# Patient Record
Sex: Male | Born: 1970
Health system: Southern US, Community
[De-identification: ages and names within clinical notes are randomized; demographics above are authoritative.]

## PROBLEM LIST (undated history)

## (undated) DIAGNOSIS — I10 Essential (primary) hypertension: Secondary | ICD-10-CM

## (undated) HISTORY — PX: APPENDECTOMY: SHX54

---

## 2007-11-02 ENCOUNTER — Emergency Department (HOSPITAL_COMMUNITY): Admission: EM | Admit: 2007-11-02 | Discharge: 2007-11-02 | Payer: Self-pay | Admitting: Emergency Medicine

## 2007-11-07 ENCOUNTER — Ambulatory Visit: Payer: Self-pay | Admitting: Family Medicine

## 2007-11-07 DIAGNOSIS — F17200 Nicotine dependence, unspecified, uncomplicated: Secondary | ICD-10-CM | POA: Insufficient documentation

## 2007-11-07 DIAGNOSIS — I4949 Other premature depolarization: Secondary | ICD-10-CM | POA: Insufficient documentation

## 2007-12-05 ENCOUNTER — Ambulatory Visit: Payer: Self-pay | Admitting: Family Medicine

## 2007-12-28 ENCOUNTER — Ambulatory Visit: Payer: Self-pay | Admitting: Family Medicine

## 2007-12-28 LAB — CONVERTED CEMR LAB
Direct LDL: 145 mg/dL
HDL: 33.7 mg/dL — ABNORMAL LOW (ref >39.0)
Total CHOL/HDL Ratio: 7.2
Triglycerides: 226 mg/dL (ref 0–149)
VLDL: 45 mg/dL — ABNORMAL HIGH (ref 0–40)

## 2008-02-16 ENCOUNTER — Telehealth: Payer: Self-pay | Admitting: Family Medicine

## 2008-03-06 ENCOUNTER — Ambulatory Visit: Payer: Self-pay | Admitting: Family Medicine

## 2008-03-06 DIAGNOSIS — E785 Hyperlipidemia, unspecified: Secondary | ICD-10-CM | POA: Insufficient documentation

## 2008-09-13 ENCOUNTER — Ambulatory Visit: Payer: Self-pay | Admitting: Family Medicine

## 2008-09-13 LAB — CONVERTED CEMR LAB: VLDL: 42.8 mg/dL — ABNORMAL HIGH (ref 0.0–40.0)

## 2010-05-13 ENCOUNTER — Encounter: Payer: Self-pay | Admitting: Internal Medicine

## 2010-05-13 ENCOUNTER — Ambulatory Visit (INDEPENDENT_AMBULATORY_CARE_PROVIDER_SITE_OTHER): Payer: 59 | Admitting: Internal Medicine

## 2010-05-13 VITALS — BP 112/80 | Temp 98.0°F | Ht 72.0 in | Wt 280.0 lb

## 2010-05-13 DIAGNOSIS — E785 Hyperlipidemia, unspecified: Secondary | ICD-10-CM

## 2010-05-13 DIAGNOSIS — R7309 Other abnormal glucose: Secondary | ICD-10-CM

## 2010-05-13 DIAGNOSIS — E119 Type 2 diabetes mellitus without complications: Secondary | ICD-10-CM

## 2010-05-13 LAB — HEMOGLOBIN A1C: Hgb A1c MFr Bld: 9 % — ABNORMAL HIGH (ref 4.6–6.5)

## 2010-05-13 LAB — GLUCOSE, POCT (MANUAL RESULT ENTRY): POC Glucose: 171

## 2010-05-13 MED ORDER — METFORMIN HCL 1000 MG PO TABS: 1000.0000 mg | ORAL_TABLET | Freq: Two times a day (BID) | ORAL | Status: DC

## 2010-05-13 NOTE — Patient Instructions (Signed)
It is important that you exercise regularly, at least 20 minutes 3 to 4 times per week.  If you develop chest pain or shortness of breath seek  medical attention.  You need to lose weight.  Consider a lower calorie diet and regular exercise.   Please check your hemoglobin A1c every 3 months   return in 3 weeks for followup Diabetes and Exercise Regular exercise is important and can help:    Control blood glucose (sugar).   Decrease blood pressure.   Control blood lipids (cholesterol and triglycerides).   Improve overall health.  BENEFITS FROM EXERCISE:  Improved fitness.   Improved flexibility.   Improved endurance.   Increased bone density.   Weight control.   Increased muscle strength.   Decreased body fat.   Improvement of the body's use of a hormone called insulin.   Increased insulin sensitivity.   Reduction of insulin needs.   Helps you feel better.   Reduces stress and tension.  People with diabetes who add exercise to their lifestyle gain additional benefits.    Weight loss.   Reduces appetite.   Improves body's use of blood glucose (sugar).   Decreases risk factors for heart disease:   Lowering of cholesterol and triglycerides.   Raising the level of good cholesterol (high-density lipoproteins [HDL]).   Lowering blood sugar.   Decreases blood pressure.  TYPE 1 DIABETES AND EXERCISE  Exercise will usually lower your blood glucose.   If blood glucose is greater than 240 mg/dl, check urine ketones. If ketones are present, do not exercise.   Location of the insulin injection sites may need to be adjusted with exercise. Avoid injecting insulin into areas of the body that will be exercised. For example, avoid injecting insulin into:   The arms when playing tennis.   The legs when jogging. For more information, discuss this with your caregiver.   Keep a record of:   Food intake.   Type and amount of exercise.   Expected peak times of  insulin action.   Blood glucose (sugar) levels.  Do this before, during and after exercise. Review your records with your caregiver(s). This will help you to develop guidelines for adjusting food intake and/or insulin amounts.   TYPE 2 DIABETES AND EXERCISE  Regular physical activity can help control blood glucose.   Exercise is important because it may:   Increase the body's sensitivity to insulin.   Improve blood glucose control.   Exercise reduces the risk of heart disease. It decreases serum cholesterol and triglycerides. It also lowers blood pressure.   Those who take insulin or oral hypoglycemic agents should watch for signs of hypoglycemia. These signs include dizziness, shaking, sweating, chills and confusion.   Body water is lost during exercise. It must be replaced. This will help to avoid loss of body fluids (dehydration) and/or heat stroke.  Be sure to talk to your caregiver before starting an exercise program to make sure it is safe for you. Remember, any activity is better than none.   Document Released: 05/16/2003 Document Re-Released: 12/21/2008 [facility] Patient Information 2011 [place], [place].Diabetes Meal Planning Guide The diabetes meal planning guide is a tool to help you plan your meals and snacks. It is important for people with diabetes to manage their blood sugar levels. Choosing the right foods and the right amounts throughout your day will help control your blood sugar. Eating right can even help you improve your blood pressure and reach or maintain a healthy weight. CARBOHYDRATE  COUNTING MADE EASY When you eat carbohydrates, they turn to sugar (glucose). This raises your blood sugar level. Counting carbohydrates can help you control this level so you feel better. When you plan your meals by counting carbohydrates, you can have more flexibility in what you eat and balance your medicine with your food intake. Carbohydrate counting simply means adding up the total  amount of carbohydrate grams (g) in your meals or snacks. Try to eat about the same amount at each meal. Foods with carbohydrates are listed below. Each portion below is 1 carbohydrate serving or 15 grams of carbohydrates. Ask your dietician how many grams of carbohydrates you should eat at each meal or snack. Grains and Starches 1 slice bread 1/2 English muffin or hotdog/hamburger bun 3/4 cup cold cereal (unsweetened) 1/3 cup cooked pasta or rice 1/2 cup starchy vegetables (corn, potatoes, peas, beans, winter squash) 1 tortilla (6 inches) 1/4 bagel 1 waffle or pancake (size of a CD) 1/2 cup cooked cereal 4 to 6 small crackers *Whole grain is recommended Fruit 1 cup fresh unsweetened berries, melon, papaya, pineapple 1 small fresh fruit 1/2 banana or mango 1/2 cup fruit juice (4 ounces unsweetened) 1/2 cup canned fruit in natural juice or water 2 tablespoons dried fruit 12 to 15 grapes or cherries Milk and Yogurt 1 cup fat-free or 1% milk 1 cup soy milk 6 ounces light yogurt with sugar-free sweetener 6 ounces low-fat soy yogurt 6 ounces plain yogurt Vegetables 1 cup raw or 1/2 cup cooked is counted as 0 carbohydrates or a "free" food. If you eat 3 or more servings at one meal, count them as 1 carbohydrate serving. Other Carbohydrates 3/4 ounces chips or pretzels 1/2 cup ice cream or frozen yogurt 1/4 cup sherbet or sorbet 2 inch square cake, no frosting 1 tablespoon honey, sugar, jam, jelly, or syrup 2 small cookies 3 squares of graham crackers 3 cups popcorn 6 crackers 1 cup broth-based soup Count 1 cup casserole or other mixed foods as 2 carbohydrate servings. Foods with less than 20 calories in a serving may be counted as 0 carbohydrates or a "free" food. You may want to purchase a book or computer software that lists the carbohydrate gram counts of different foods. In addition, the nutrition facts panel on the labels of the foods you eat are a good source of this  information. The label will tell you how big the serving size is and the total number of carbohydrate grams you will be eating per serving. Divide this number by 15 to obtain the number of carbohydrate servings in a portion. Remember: 1 carbohydrate serving equals 15 grams of carbohydrate. SERVING SIZES Measuring foods and serving sizes helps you make sure you are getting the right amount of food. The list below tells how big or small some common serving sizes are. MARGIN-BOTTOM: 0mm  1 ounce (oz) of cheese.................................4 stacked dice.  2 to 3 oz cooked meat.................................Marland KitchenDeck of cards.  1 teaspoon (tsp)...........................................Marland KitchenTip of little finger.  1 tablespoon (tbs).......................................Marland KitchenMarland KitchenThumb.  2 tbs............................................................Marland KitchenGolf ball.   cup..........................................................Marland KitchenHalf of a fist.  1 cup...........................................................Marland KitchenA fist.  SAMPLE DIABETES MEAL PLAN Below is a sample meal plan that includes foods from the grain and starches, dairy, vegetable, fruit, and meat groups. A dietician can individualize a meal plan to fit your calorie needs and tell you the number of servings needed from each food group. However, controlling the total amount of carbohydrates in your meal or snack is more important than making sure you include all of  the food groups at every meal. You may interchange carbohydrate containing foods (dairy, starches, and fruits). The meal plan below is an example of a 2000 calorie diet using carbohydrate counting. This meal plan has 17 carbohydrate servings (carb choices). Breakfast  1 cup oatmeal (2 carb choices)  3/4 cup light yogurt (1 carb choice) 1 cup blueberries (1 carb choice) 1/4 cup almonds   Snack  1 large apple (2 carb choices)  1 low-fat string cheese stick   Lunch  Chicken breast salad:   1 cup  spinach     1/4 cup chopped tomatoes     2 oz chicken breast, sliced     2 tbs low-fat [place] dressing  12 whole-wheat crackers (2 carb choices) 12 to 15 grapes (1 carb choice) 1 cup low-fat milk (1 carb choice)   Snack  1 cup carrots  1/2 cup hummus (1 carb choice)   Dinner  3 oz broiled salmon  1 cup brown rice (3 carb choices)   Snack  1 1/2 cups steamed broccoli (1 carb choice) drizzled with 1 tsp olive oil and lemon juice  1 cup light pudding (2 carb choices)   DIABETES MEAL PLANNING WORKSHEET Your dietician can use this worksheet to help you decide how many servings of foods and what types of foods are right for you.   Breakfast Food Group and Servings Carb Choices Grain/Starches _______________________________________ Dairy ______________________________________________ Vegetable _______________________________________ Fruit _______________________________________________ Meat _______________________________________________ Fat _____________________________________________ Lunch Food Group and Servings Carb Choices Grain/Starches ________________________________________ Dairy _______________________________________________ Fruit ________________________________________________ Meat ________________________________________________ Fat _____________________________________________ Dinner Food Group and Servings Carb Choices Grain/Starches ________________________________________ Dairy _______________________________________________ Fruit ________________________________________________ Meat ________________________________________________ Fat _____________________________________________ Snacks Food Group and Servings Carb Choices Grain/Starches ________________________________________ Dairy _______________________________________________ Vegetable ________________________________________ Fruit ________________________________________________ Meat  ________________________________________________ Fat _____________________________________________ Daily Totals Starches _________________________  Vegetable __________________________ Fruit ______________________________ Dairy ______________________________ Meat ______________________________ Fat ________________________________   Document Released: 11/20/2004 Document Re-Released: 08/13/2009 [employer] Patient Information 2011 [place], [place].Diabetes, Type 2 Diabetes is a lasting (chronic) disease. In type 2 diabetes, the pancreas does not make enough insulin (a hormone), and the body does not respond normally to the insulin that is made. This type of diabetes was also previously called adult onset diabetes. About 90% of all those who have diabetes have type 2. It usually occurs after the age of 47 but can occur at any age. CAUSES Unlike type 1 diabetes, which happens because insulin is no longer being made, type 2 diabetes happens because the body is making less insulin and has trouble using the insulin properly. SYMPTOMS  Drinking more than usual.   Urinating more than usual.   Blurred vision.   Dry, itchy skin.   Frequent infection like yeast infections in women.   More tired than usual (fatigue).  TREATMENT  Healthy eating.   Exercise.   Medication, if needed.   Monitoring blood glucose (sugar).   Seeing your caregiver regularly.  HOME CARE INSTRUCTIONS  Check your blood glucose (sugar) at least once daily. More frequent monitoring may be necessary, depending on your medications and on how well your diabetes is controlled. Your caregiver will advise you.   Take your medicine as directed by your caregiver.   Do not smoke.   Make wise food choices. Ask your caregiver for information. Weight loss can improve your diabetes.   Learn about low blood glucose (hypoglycemia) and how to treat it.   Get your eyes checked regularly.   Have a yearly physical exam. Have  your blood pressure checked.  Get your blood and urine tested.   Wear a pendant or bracelet saying that you have diabetes.   Check your feet every night for sores. Let your caregiver know if you have sores that are not healing.  SEEK MEDICAL CARE IF:  You are having problems keeping your blood glucose at target range.   You feel you might be having problems with your medicines.   You have symptoms of an illness that is not improving after 24 hours.   You have a sore or wound that is not healing.   You notice a change in vision or a new problem with your vision.   You develop a fever of more than  102 degrees.  Document Released: 02/23/2005 Document Re-Released: 03/17/2009 [facility] Patient Information 2011 [place], [place].

## 2010-05-13 NOTE — Progress Notes (Signed)
  Subjective:    Patient ID: Nicholas [name], male    DOB: 12-06-1970, 40 y.o.   MRN: [id number]  HPI   40 year old patient who has a strong family history diabetes it is noted to have the persistent hyperglycemia with random blood sugars occasionally in excess of 300. He denies any hyperglycemic symptoms. He has a history of mild dyslipidemia and his last total cholesterol 220. He discontinued tobacco use in September of 2009. He does have a history of exogenous obesity    Review of Systems  Constitutional: Negative for fever, chills, appetite change and fatigue.  HENT: Negative for hearing loss, ear pain, congestion, sore throat, trouble swallowing, neck stiffness, dental problem, voice change and tinnitus.   Eyes: Negative for pain, discharge and visual disturbance.  Respiratory: Negative for cough, chest tightness, wheezing and stridor.   Cardiovascular: Negative for chest pain, palpitations and leg swelling.  Gastrointestinal: Negative for nausea, vomiting, abdominal pain, diarrhea, constipation, blood in stool and abdominal distention.  Genitourinary: Negative for urgency, hematuria, flank pain, discharge, difficulty urinating and genital sores.  Musculoskeletal: Negative for myalgias, back pain, joint swelling, arthralgias and gait problem.  Skin: Negative for rash.  Neurological: Negative for dizziness, syncope, speech difficulty, weakness, numbness and headaches.  Hematological: Negative for adenopathy. Does not bruise/bleed easily.  Psychiatric/Behavioral: Negative for behavioral problems and dysphoric mood. The patient is not nervous/anxious.        Objective:   Physical Exam  Constitutional: He appears well-developed and well-nourished. No distress.        Weight 280   blood pressure 112/72          Assessment & Plan:   new onset diabetes  Exogenous obesity  Dyslipidemia   We'll start on metformin and titrate the dose to 1000 mg twice daily. Exercise weight loss diet  all discussed and encouraged. Diabetic information dispensed as well as a glucometer. A hemoglobin A1c will be checked today. He is asked return in 3 weeks for followup

## 2010-05-19 ENCOUNTER — Ambulatory Visit: Payer: Self-pay | Admitting: Family Medicine

## 2010-05-20 ENCOUNTER — Telehealth: Payer: Self-pay | Admitting: Family Medicine

## 2010-05-20 MED ORDER — ONETOUCH ULTRASOFT LANCETS MISC: Status: DC

## 2010-05-20 MED ORDER — GLUCOSE BLOOD VI STRP: ORAL_STRIP | Status: DC

## 2010-05-20 NOTE — Telephone Encounter (Signed)
Was given a meter and tests strips when he saw Dr Kirtland Bouchard. Pt is requesting a new rxs for test strips and lancets sent to [facility]. Please return his call when done.

## 2010-05-22 ENCOUNTER — Telehealth: Payer: Self-pay | Admitting: Internal Medicine

## 2010-05-22 MED ORDER — GLUCOSE BLOOD VI STRP: ORAL_STRIP | Status: DC

## 2010-05-22 NOTE — Telephone Encounter (Signed)
rx sent and spoke with pharamcy

## 2010-05-22 NOTE — Telephone Encounter (Signed)
Pt called again and stated the the pharmacy did not receive his rx for One Touch Blue Delicate test strips. Please send to his pharmacy as listed on previous note and return his call when done, asap.

## 2010-06-05 ENCOUNTER — Encounter: Payer: Self-pay | Admitting: Family Medicine

## 2010-06-05 ENCOUNTER — Ambulatory Visit (INDEPENDENT_AMBULATORY_CARE_PROVIDER_SITE_OTHER): Payer: 59 | Admitting: Family Medicine

## 2010-06-05 VITALS — BP 120/84 | Temp 98.2°F | Wt 270.0 lb

## 2010-06-05 DIAGNOSIS — IMO0001 Reserved for inherently not codable concepts without codable children: Secondary | ICD-10-CM

## 2010-06-05 DIAGNOSIS — E1165 Type 2 diabetes mellitus with hyperglycemia: Secondary | ICD-10-CM

## 2010-06-05 DIAGNOSIS — E1142 Type 2 diabetes mellitus with diabetic polyneuropathy: Secondary | ICD-10-CM | POA: Insufficient documentation

## 2010-06-05 MED ORDER — METFORMIN HCL 1000 MG PO TABS: ORAL_TABLET | ORAL | Status: DC

## 2010-06-05 MED ORDER — LISINOPRIL 5 MG PO TABS: 5.0000 mg | ORAL_TABLET | Freq: Every day | ORAL | Status: DC

## 2010-06-05 NOTE — Patient Instructions (Signed)
Split the metformin, take 500 mg prior to breakfast and 500 mg prior to evening meal.  Walk 30 minutes daily.  Drink 30 ounces of water daily.  Check a fasting blood sugar daily.  Add lisinopril,,,,,,,,,,, the ACE inhibitor for renal protection,,,,,,,, 5 mg daily,,,,,,,,, remembered to look for hives, and/or cough  as potential side effects  Return in one month for follow-up.  We will also get set up for a nutrition consult

## 2010-06-05 NOTE — Progress Notes (Signed)
  Subjective:    Patient ID: Nicholas [name], male    DOB: 05/08/1970, 40 y.o.   MRN: [id number]  HPIjeremi Is a 40 year old male, recently diagnosed with type 2 diabetes, who comes in today for follow-up.  Nicholas [name] saw Dr. Mariella Saa weeks ago and was put on metformin 1000 mg b.i.d., but Nicholas [name] couldn't tolerate it.  GI-wise.  His hemoglobin A1c was 9.0 with a blood sugar of 171 at that point.  His only taken metformin 1000 mg after his evening meal.  However, Nicholas [name] still having GI side effects.  BP normal 120/84    Review of Systems      General and metabolic review of systems otherwise negative Objective:   Physical Exam Well-developed well-nourished man no acute distress,,,,,,,,,,,,,,,, Nicholas [name]'s lost 10 pounds via diet since Nicholas [name] was here last       Assessment & Plan:  Diabetes type II,,,,,,,,,,, split metformin 500 b.i.d., baby aspirin daily, nutrition consult with the nutritionist, walk 30 minutes.  A day, fasting blood sugar daily, and ACE inhibitor, follow up in 4 weeks

## 2010-07-07 ENCOUNTER — Encounter: Payer: Self-pay | Admitting: Family Medicine

## 2010-07-07 ENCOUNTER — Ambulatory Visit (INDEPENDENT_AMBULATORY_CARE_PROVIDER_SITE_OTHER): Payer: 59 | Admitting: Family Medicine

## 2010-07-07 VITALS — BP 130/84 | Temp 98.3°F | Wt 258.0 lb

## 2010-07-07 DIAGNOSIS — IMO0001 Reserved for inherently not codable concepts without codable children: Secondary | ICD-10-CM

## 2010-07-07 DIAGNOSIS — E1165 Type 2 diabetes mellitus with hyperglycemia: Secondary | ICD-10-CM

## 2010-07-07 MED ORDER — GLIPIZIDE 5 MG PO TABS: 5.0000 mg | ORAL_TABLET | Freq: Two times a day (BID) | ORAL | Status: DC

## 2010-07-07 NOTE — Patient Instructions (Signed)
Before breakfast, take 500 mg of metformin and 5 mg of glipizide,,,,,,,,,, prior to evening meal, take 1000 mg of metformin and 5 mg of glipizide.  Check a fasting blood sugar daily in the morning.  Return in 4 weeks for follow-up with all your blood sugar readings

## 2010-07-07 NOTE — Progress Notes (Signed)
  Subjective:    Patient ID: Nissan Frazzini, male    DOB: [birth year], 40 y.o.   MRN: [id number]  HPIjeremi History 40 year old male, type II diabetic, who comes in today for follow-up of his diabetes.  We had him on metformin 500 b.i.d. However, his blood sugar jumped up.  His A1c was 9.  Dr. Kirtland Bouchard. Increase the metformin 2000 b.i.d. However, he cannot tolerated GI-wise.  He cut the dose down to 500 morning thousand 4.  His evening meal.  He still having symptoms of cramps and bloating and loose bowel movements.  He thinks he can tolerate this.  We talked about other options, including starting insulin, however, he would like to try other oral medicines first    Review of Systems    General and metabolic review of systems otherwise negative Objective:   Physical Exam    Well-developed well-nourished, and in no acute distress    Assessment & Plan:  Diabetes type II.  Plan add Glucotrol 5 b.i.d. Continue metformin 1000 in the p.m., 500 mg in the a.m. Fasting blood sugar daily.  Follow-up in one month

## 2010-07-18 ENCOUNTER — Other Ambulatory Visit: Payer: Self-pay | Admitting: Family Medicine

## 2010-07-18 MED ORDER — ONETOUCH DELICA LANCETS MISC: Status: DC

## 2010-07-18 MED ORDER — GLUCOSE BLOOD VI STRP: ORAL_STRIP | Status: DC

## 2010-07-18 NOTE — Telephone Encounter (Signed)
Need new rxs for One Touch Ultra test strips and One Touch Delica lancets called to [facility].

## 2010-08-07 ENCOUNTER — Ambulatory Visit: Payer: 59 | Admitting: Family Medicine

## 2010-08-14 ENCOUNTER — Telehealth: Payer: Self-pay | Admitting: Family Medicine

## 2010-08-14 DIAGNOSIS — E1165 Type 2 diabetes mellitus with hyperglycemia: Secondary | ICD-10-CM

## 2010-08-14 MED ORDER — METFORMIN HCL 1000 MG PO TABS: ORAL_TABLET | ORAL | Status: DC

## 2010-08-14 NOTE — Telephone Encounter (Signed)
Triage vm----------please clarify dosing of Metformin HCT 1000mg . Directions says 1/2 tablet bid before meals. Is it 1 1/2 or 1/2 tablet. Pt was on 1qd previously.

## 2010-08-28 ENCOUNTER — Ambulatory Visit: Payer: 59 | Admitting: Family Medicine

## 2010-08-29 ENCOUNTER — Other Ambulatory Visit: Payer: Self-pay | Admitting: *Deleted

## 2010-08-29 MED ORDER — GLUCOSE BLOOD VI STRP: 1.0000 | ORAL_STRIP | Freq: Three times a day (TID) | Status: DC

## 2010-08-29 NOTE — Telephone Encounter (Signed)
rx clarification

## 2010-09-15 ENCOUNTER — Encounter: Payer: Self-pay | Admitting: Family Medicine

## 2010-09-15 ENCOUNTER — Ambulatory Visit (INDEPENDENT_AMBULATORY_CARE_PROVIDER_SITE_OTHER): Payer: 59 | Admitting: Family Medicine

## 2010-09-15 VITALS — BP 120/80 | Temp 98.0°F | Wt 278.0 lb

## 2010-09-15 DIAGNOSIS — E1165 Type 2 diabetes mellitus with hyperglycemia: Secondary | ICD-10-CM

## 2010-09-15 DIAGNOSIS — IMO0001 Reserved for inherently not codable concepts without codable children: Secondary | ICD-10-CM

## 2010-09-15 LAB — BASIC METABOLIC PANEL
BUN: 15 mg/dL (ref 6–23)
GFR: 101.82 mL/min (ref >60.00)
Potassium: 4.3 mEq/L (ref 3.5–5.1)
Sodium: 139 mEq/L (ref 135–145)

## 2010-09-15 LAB — MICROALBUMIN / CREATININE URINE RATIO: Microalb, Ur: 3.9 mg/dL — ABNORMAL HIGH (ref 0.0–1.9)

## 2010-09-15 LAB — HEMOGLOBIN A1C: Hgb A1c MFr Bld: 6.1 % (ref 4.6–6.5)

## 2010-09-15 NOTE — Progress Notes (Signed)
  Subjective:    Patient ID: Nicholas [name], male    DOB: [birth year], 40 y.o.   MRN: [id number]  HPI arsen Is a 40 year old male, who comes in today for follow-up of diabetes, type II.  He currently taking metformin 1000 mg in the evening and 500 mg in the morning prior to breakfast.  He cannot take any more medications for her because of GI side effects.  His glipizide is 5 mg b.i.d. Blood sugar in the morning is around 135 in the evening is around 145.  No hypoglycemia.  Last A1c in March was 9.0%.  Is due for an eye exam referred to Dr. Vonna Kotyk   Review of Systems General and metabolic review of systems otherwise negative.  BP normal 120/80    Objective:   Physical Exam    Well-developed nourished, male in no acute distress    Assessment & Plan:  Diabetes type II.  Increased Glucotrol 10 mg in the evening 5 mg in the morning.  If after two weeks.  Blood sugar is still not at 101.  Increase glipizide to 10 mg b.i.d.  Follow-up in 6 weeks here

## 2010-09-15 NOTE — Patient Instructions (Signed)
Increase the glipizide to 10 mg in the evening and continue the 5-mg dose in the morning.  However, if in two weeks.  Her blood sugars not at goal,,,,,,,,,,,, fasting in the morning, 100,,,,,,,, then increase the glipizide to 10 mg twice daily.  Continue current dose of metformin.  Follow-up in 6 weeks.  I will call u The report of your A1c

## 2010-09-17 NOTE — Progress Notes (Signed)
Left message on machine for patient

## 2010-10-13 ENCOUNTER — Telehealth: Payer: Self-pay | Admitting: Family Medicine

## 2010-10-13 NOTE — Telephone Encounter (Signed)
Pt is requesting his dosage be adjusted he is currently splitting a 1000mg  pill in half and taking 500 in the am and 500 in the pm. Pt is having an issue with splitting the pill and is worried he isnt getting the right dose. Please contact pt.

## 2010-10-14 MED ORDER — METFORMIN HCL 500 MG PO TABS: 500.0000 mg | ORAL_TABLET | Freq: Two times a day (BID) | ORAL | Status: DC

## 2010-10-14 NOTE — Telephone Encounter (Signed)
Okay to e-mail and the metformin 500-mg tabs number 200 directions one p.o. B.i.d. Refills x 3, so he doesn't have to split the 1000 mg tablets

## 2010-10-27 ENCOUNTER — Ambulatory Visit: Payer: 59 | Admitting: Family Medicine

## 2010-11-17 ENCOUNTER — Ambulatory Visit: Payer: 59 | Admitting: Family Medicine

## 2010-12-08 ENCOUNTER — Ambulatory Visit: Payer: 59 | Admitting: Family Medicine

## 2011-06-06 ENCOUNTER — Other Ambulatory Visit: Payer: Self-pay | Admitting: Family Medicine

## 2011-06-22 ENCOUNTER — Ambulatory Visit (INDEPENDENT_AMBULATORY_CARE_PROVIDER_SITE_OTHER): Payer: 59 | Admitting: Family Medicine

## 2011-06-22 ENCOUNTER — Encounter: Payer: Self-pay | Admitting: Family Medicine

## 2011-06-22 VITALS — BP 118/80 | HR 100 | Temp 98.1°F | Wt 290.0 lb

## 2011-06-22 DIAGNOSIS — M542 Cervicalgia: Secondary | ICD-10-CM

## 2011-06-22 MED ORDER — CYCLOBENZAPRINE HCL 10 MG PO TABS: 10.0000 mg | ORAL_TABLET | Freq: Three times a day (TID) | ORAL | Status: AC | PRN

## 2011-06-22 MED ORDER — PREDNISONE (PAK) 10 MG PO TABS: 10.0000 mg | ORAL_TABLET | Freq: Every day | ORAL | Status: AC

## 2011-06-22 NOTE — Progress Notes (Signed)
  Subjective:    Patient ID: Nicholas [name], male    DOB: September 11, 1970, 41 y.o.   MRN: [id number]  HPI Here for 2 weeks of sharp pains in the left upper back and shoulder that shoot down the left arm. He also has numbness that runs down the arm and into the left thumb. No weakness. No recent trauma, but he thinks it started after he changed a tire on his wife's car. Using Motrin.   Review of Systems  Constitutional: Negative.   HENT: Positive for neck pain and neck stiffness.   Respiratory: Negative.   Cardiovascular: Negative.        Objective:   Physical Exam  Constitutional: He appears well-developed and well-nourished.  Musculoskeletal:       The neck has some pain on flexion and extension but is not tender. The left upper trapezius is tender.           Assessment & Plan:  This is due to a pinched nerve in his neck. Rest, heat, Prednisone and Flexeril. Recheck prn

## 2011-07-13 ENCOUNTER — Other Ambulatory Visit: Payer: Self-pay | Admitting: Family Medicine

## 2011-07-21 ENCOUNTER — Other Ambulatory Visit: Payer: Self-pay | Admitting: Family Medicine

## 2011-10-26 ENCOUNTER — Other Ambulatory Visit: Payer: Self-pay | Admitting: Family Medicine

## 2011-11-19 ENCOUNTER — Other Ambulatory Visit: Payer: Self-pay | Admitting: Family Medicine

## 2011-12-22 ENCOUNTER — Encounter: Payer: Self-pay | Admitting: Family Medicine

## 2011-12-22 ENCOUNTER — Ambulatory Visit (INDEPENDENT_AMBULATORY_CARE_PROVIDER_SITE_OTHER): Payer: 59 | Admitting: Family Medicine

## 2011-12-22 VITALS — BP 110/80 | Temp 98.0°F | Wt 294.0 lb

## 2011-12-22 DIAGNOSIS — E119 Type 2 diabetes mellitus without complications: Secondary | ICD-10-CM

## 2011-12-22 DIAGNOSIS — IMO0001 Reserved for inherently not codable concepts without codable children: Secondary | ICD-10-CM

## 2011-12-22 DIAGNOSIS — E1165 Type 2 diabetes mellitus with hyperglycemia: Secondary | ICD-10-CM

## 2011-12-22 LAB — CBC WITH DIFFERENTIAL/PLATELET
Basophils Absolute: 0 10*3/uL (ref 0.0–0.1)
Eosinophils Absolute: 0.1 10*3/uL (ref 0.0–0.7)
HCT: 46.9 % (ref 39.0–52.0)
Lymphs Abs: 2.1 10*3/uL (ref 0.7–4.0)
MCV: 94.6 fl (ref 78.0–100.0)
Monocytes Absolute: 0.5 10*3/uL (ref 0.1–1.0)
Platelets: 233 10*3/uL (ref 150.0–400.0)
RDW: 12.5 % (ref 11.5–14.6)

## 2011-12-22 LAB — HEPATIC FUNCTION PANEL
AST: 27 U/L (ref 0–37)
Albumin: 4 g/dL (ref 3.5–5.2)
Alkaline Phosphatase: 68 U/L (ref 39–117)
Total Bilirubin: 1 mg/dL (ref 0.3–1.2)

## 2011-12-22 LAB — POCT URINALYSIS DIPSTICK
Leukocytes, UA: NEGATIVE
Nitrite, UA: NEGATIVE
Protein, UA: NEGATIVE
pH, UA: 5.5

## 2011-12-22 LAB — LIPID PANEL
Cholesterol: 204 mg/dL — ABNORMAL HIGH (ref 0–200)
Total CHOL/HDL Ratio: 6

## 2011-12-22 LAB — BASIC METABOLIC PANEL
BUN: 14 mg/dL (ref 6–23)
Chloride: 100 mEq/L (ref 96–112)
Glucose, Bld: 189 mg/dL — ABNORMAL HIGH (ref 70–99)
Potassium: 4.5 mEq/L (ref 3.5–5.1)

## 2011-12-22 MED ORDER — GLUCOSE BLOOD VI STRP: 1.0000 | ORAL_STRIP | Freq: Every day | Status: DC

## 2011-12-22 NOTE — Progress Notes (Signed)
  Subjective:    Patient ID: Nicholas [name], male    DOB: 1970-11-14, 41 y.o.   MRN: [id number]  HPI Nicholas [name] is a 41 year old male who comes in to discuss his diabetes  He states on the glipizide 5 mg twice a day and metformin 500 mg twice a day his blood sugars not at goal and he's having side effects from both medications. His weight is up to 294 pounds.  We discussed all the various options. We elected to start him on low-dose insulin 10 units of the 7525 daily with his evening meal stop the glipizide and metformin and get a consult from [facility]. I gave him his first 10 units insulin shot here today in the office   Review of Systems    general and metabolic review of systems otherwise negative Objective:   Physical Exam  Well-developed well-nourished male no acute distress      Assessment & Plan:  Diabetes not at goal and side effects from glipizide and metformin,,,,,,,,,, stop glipizide and metformin insulin 7525-10 units daily to begin fasting blood sugar daily in the morning consult with Tollie Eth

## 2011-12-22 NOTE — Patient Instructions (Signed)
Stop the glipizide and metformin  10 units of insulin with your evening meal  Fasting blood sugar daily in the morning  We will get you set up for a consult with Tollie Eth diabetic nurse educator  Take a copy of all blood sugar readings to every appointment

## 2011-12-23 LAB — LDL CHOLESTEROL, DIRECT: Direct LDL: 130.4 mg/dL

## 2012-01-04 ENCOUNTER — Telehealth: Payer: Self-pay | Admitting: Family Medicine

## 2012-01-04 NOTE — Telephone Encounter (Signed)
Pt called req lab results. Pls call.  °

## 2012-01-04 NOTE — Telephone Encounter (Signed)
Spoke with patient.

## 2012-01-07 ENCOUNTER — Encounter: Payer: Self-pay | Admitting: Family Medicine

## 2012-01-07 ENCOUNTER — Ambulatory Visit (INDEPENDENT_AMBULATORY_CARE_PROVIDER_SITE_OTHER): Payer: 59 | Admitting: Family Medicine

## 2012-01-07 VITALS — BP 120/84 | Temp 98.0°F | Wt 290.0 lb

## 2012-01-07 DIAGNOSIS — IMO0001 Reserved for inherently not codable concepts without codable children: Secondary | ICD-10-CM

## 2012-01-07 DIAGNOSIS — E1165 Type 2 diabetes mellitus with hyperglycemia: Secondary | ICD-10-CM

## 2012-01-07 MED ORDER — GLUCOSE BLOOD VI STRP: 1.0000 | ORAL_STRIP | Freq: Four times a day (QID) | Status: DC

## 2012-01-07 NOTE — Progress Notes (Signed)
  Subjective:    Patient ID: Nicholas [name], male    DOB: [birth year], 41 y.o.   MRN: [id number]  HPI lawrance is a delightful 41 year old male,,,,,,,,, X. smoker x40 years,,,,,,,,,,,,,,,,, who comes in today for followup of diabetes type 1  He is currently on 20 units of 75-25 with p.m. meal  Fasting blood sugar still in the 160-180 range.  His laboratory data shows a normal urine some abnormalities in his lipids normal liver tests CBC A1c of 7.5% some microalbumin 2.9 and his urine sugar that day was 189 and LDL cholesterol was 1:30. I explained to them that these other abnormalities will need to be followed and if his lipids normalized once his blood sugar normalized then we will need to do nothing further. If however lipids do not normalize goal of LDL below 75 then we'll start a lipid-lowering drug. BP 120/84    Review of Systems     general and metabolic review of systems otherwise negative  Objective:   Physical Exam   well-developed well-nourished man in acute distress       blood sugar drops to 100-120 fasting in the morning diabetes type 1 diabetes type 1 plan to see instructionslan:

## 2012-01-07 NOTE — Patient Instructions (Signed)
Increase your insulin to 25 units nightly  Increase it by 5 units every 2 weeks until blood sugar in the morning is 100-120 range  Remember to call in the  Fasting blood sugar once daily in the morning  Followup with me in one month

## 2012-02-01 ENCOUNTER — Telehealth: Payer: Self-pay | Admitting: Family Medicine

## 2012-02-01 DIAGNOSIS — E1165 Type 2 diabetes mellitus with hyperglycemia: Secondary | ICD-10-CM

## 2012-02-01 NOTE — Telephone Encounter (Signed)
Pt called and needs to get a referral to see Dr Sharl Ma at [facility].

## 2012-02-01 NOTE — Telephone Encounter (Signed)
Referral request sent 

## 2012-02-03 ENCOUNTER — Other Ambulatory Visit: Payer: Self-pay | Admitting: Family Medicine

## 2012-02-03 DIAGNOSIS — E1165 Type 2 diabetes mellitus with hyperglycemia: Secondary | ICD-10-CM

## 2012-02-08 ENCOUNTER — Ambulatory Visit: Payer: 59 | Admitting: Family Medicine

## 2012-04-13 ENCOUNTER — Other Ambulatory Visit: Payer: Self-pay | Admitting: Family Medicine

## 2012-04-13 MED ORDER — LISINOPRIL 5 MG PO TABS: 5.0000 mg | ORAL_TABLET | Freq: Every day | ORAL | Status: DC

## 2012-04-13 NOTE — Telephone Encounter (Signed)
Pt needs refill of lisinopril (PRINIVIL,ZESTRIL) 5 MG tablet. Pharm:[facility]

## 2012-04-13 NOTE — Telephone Encounter (Signed)
Rx sent 

## 2012-04-14 ENCOUNTER — Ambulatory Visit: Payer: 59 | Admitting: Family Medicine

## 2012-04-20 ENCOUNTER — Other Ambulatory Visit: Payer: Self-pay | Admitting: Family Medicine

## 2012-06-14 ENCOUNTER — Ambulatory Visit: Payer: 59 | Admitting: Family Medicine

## 2012-06-28 ENCOUNTER — Other Ambulatory Visit: Payer: Self-pay | Admitting: Family Medicine

## 2012-07-11 ENCOUNTER — Ambulatory Visit (INDEPENDENT_AMBULATORY_CARE_PROVIDER_SITE_OTHER): Payer: 59 | Admitting: Family Medicine

## 2012-07-11 ENCOUNTER — Encounter: Payer: Self-pay | Admitting: Family Medicine

## 2012-07-11 VITALS — BP 110/80 | Temp 98.4°F | Wt 286.0 lb

## 2012-07-11 DIAGNOSIS — IMO0001 Reserved for inherently not codable concepts without codable children: Secondary | ICD-10-CM

## 2012-07-11 DIAGNOSIS — E1165 Type 2 diabetes mellitus with hyperglycemia: Secondary | ICD-10-CM

## 2012-07-11 MED ORDER — LISINOPRIL 5 MG PO TABS: 5.0000 mg | ORAL_TABLET | Freq: Every day | ORAL | Status: DC

## 2012-07-11 MED ORDER — SITAGLIP PHOS-METFORMIN HCL ER 50-500 MG PO TB24: 1.0000 | ORAL_TABLET | Freq: Two times a day (BID) | ORAL | Status: DC

## 2012-07-11 NOTE — Progress Notes (Signed)
  Subjective:    Patient ID: Nicholas [name], male    DOB: 07/01/1970, 42 y.o.   MRN: [id number]  HPI Nicholas [name]  Is a 42 year old male X. Smoker x4 years,,,,,,,,,,,,, who comes in today for follow up of his diabetes  Last fall his diabetes was not at goal therefore we had him go see Tollie Eth. They stopped his insulin and started him on Janumet 50-500 dose 1 tab twice a day. And stopped the Glucotrol. His blood sugar now is normalized with an A1c 2 months ago was 6.5%.  He hasn't taken his lisinopril 5 mg daily for renal protection in about a month.   Review of Systems    review of systems negative except she's due for a physical exam Objective:   Physical Exam  Well-developed well-nourished male in no acute distress vital signs stable afebrile BP 110/80      Assessment & Plan:  Diabetes at goal with A1c 2 months ago 6.5 schedule CPX restart lisinopril

## 2012-07-11 NOTE — Patient Instructions (Addendum)
ccontinue your current medications  Set up a time in the next 2 months for complete physical exam  Fasting labs one week prior

## 2012-10-18 ENCOUNTER — Encounter: Payer: Self-pay | Admitting: Family Medicine

## 2012-10-18 ENCOUNTER — Other Ambulatory Visit (INDEPENDENT_AMBULATORY_CARE_PROVIDER_SITE_OTHER): Payer: 59

## 2012-10-18 DIAGNOSIS — IMO0001 Reserved for inherently not codable concepts without codable children: Secondary | ICD-10-CM

## 2012-10-18 DIAGNOSIS — E1165 Type 2 diabetes mellitus with hyperglycemia: Secondary | ICD-10-CM

## 2012-10-18 LAB — POCT URINALYSIS DIPSTICK
Bilirubin, UA: NEGATIVE
Ketones, UA: NEGATIVE
Leukocytes, UA: NEGATIVE

## 2012-10-18 LAB — CBC WITH DIFFERENTIAL/PLATELET
Eosinophils Relative: 2.5 % (ref 0.0–5.0)
HCT: 44.4 % (ref 39.0–52.0)
Lymphs Abs: 1.6 10*3/uL (ref 0.7–4.0)
Monocytes Relative: 6 % (ref 3.0–12.0)
Neutrophils Relative %: 64.6 % (ref 43.0–77.0)
Platelets: 209 10*3/uL (ref 150.0–400.0)
WBC: 6.2 10*3/uL (ref 4.5–10.5)

## 2012-10-18 LAB — MICROALBUMIN / CREATININE URINE RATIO
Creatinine,U: 157 mg/dL
Microalb Creat Ratio: 1.1 mg/g (ref 0.0–30.0)
Microalb, Ur: 1.8 mg/dL (ref 0.0–1.9)

## 2012-10-18 LAB — BASIC METABOLIC PANEL
Calcium: 9 mg/dL (ref 8.4–10.5)
Creatinine, Ser: 0.9 mg/dL (ref 0.4–1.5)

## 2012-10-18 LAB — HEPATIC FUNCTION PANEL
AST: 30 U/L (ref 0–37)
Albumin: 3.8 g/dL (ref 3.5–5.2)

## 2012-10-18 LAB — LIPID PANEL
Cholesterol: 200 mg/dL (ref 0–200)
Total CHOL/HDL Ratio: 6
Triglycerides: 210 mg/dL — ABNORMAL HIGH (ref 0.0–149.0)
VLDL: 42 mg/dL — ABNORMAL HIGH (ref 0.0–40.0)

## 2012-10-18 LAB — LDL CHOLESTEROL, DIRECT: Direct LDL: 135 mg/dL

## 2012-10-18 LAB — HEMOGLOBIN A1C: Hgb A1c MFr Bld: 7.5 % — ABNORMAL HIGH (ref 4.6–6.5)

## 2012-10-18 LAB — TSH: TSH: 0.65 u[IU]/mL (ref 0.35–5.50)

## 2012-10-27 ENCOUNTER — Encounter: Payer: 59 | Admitting: Family Medicine

## 2013-01-19 ENCOUNTER — Telehealth: Payer: Self-pay | Admitting: Family Medicine

## 2013-01-19 DIAGNOSIS — E1165 Type 2 diabetes mellitus with hyperglycemia: Secondary | ICD-10-CM

## 2013-01-19 MED ORDER — LISINOPRIL 5 MG PO TABS: 5.0000 mg | ORAL_TABLET | Freq: Every day | ORAL | Status: DC

## 2013-01-19 NOTE — Telephone Encounter (Signed)
Rx sent to pharmacy   

## 2013-01-19 NOTE — Telephone Encounter (Signed)
Pt is out of medication and would [facility] [place] lisin[facility]ril (PRINIVIL,ZESTRIL) 5 MG tablet

## 2013-02-23 ENCOUNTER — Ambulatory Visit: Payer: 59 | Admitting: Family Medicine

## 2013-03-14 ENCOUNTER — Encounter: Payer: Self-pay | Admitting: Family Medicine

## 2013-03-14 ENCOUNTER — Ambulatory Visit (INDEPENDENT_AMBULATORY_CARE_PROVIDER_SITE_OTHER): Payer: 59 | Admitting: Family Medicine

## 2013-03-14 VITALS — BP 120/80 | Temp 98.2°F | Wt 283.0 lb

## 2013-03-14 DIAGNOSIS — E785 Hyperlipidemia, unspecified: Secondary | ICD-10-CM

## 2013-03-14 DIAGNOSIS — E1165 Type 2 diabetes mellitus with hyperglycemia: Secondary | ICD-10-CM

## 2013-03-14 DIAGNOSIS — IMO0001 Reserved for inherently not codable concepts without codable children: Secondary | ICD-10-CM

## 2013-03-14 DIAGNOSIS — IMO0002 Reserved for concepts with insufficient information to code with codable children: Secondary | ICD-10-CM

## 2013-03-14 NOTE — Progress Notes (Signed)
Pre visit review using our clinic review tool, if applicable. No additional management support is needed unless otherwise documented below in the visit note. 

## 2013-03-14 NOTE — Progress Notes (Signed)
   Subjective:    Patient ID: Nicholas [name], male    DOB: [birth year], 43 y.o.   MRN: [id number]  HPI Nicholas [name] is a [age] year old male who comes in today for evaluation of diabetes type 2  His weight is up to 283 over the holidays. He takes [employer] 50-500 dose one twice a day and lisinopril 5 mg daily BP 120/80   Review of Systems Review of systems negative    Objective:   Physical Exam  Well-developed well nourished male no acute distress vital signs stable he is afebrile  A1c 7.5%    Assessment & Plan:  Diabetes type 2 not at goal but approaching goal.......Marland Kitchen. plan see orders

## 2013-03-14 NOTE — Patient Instructions (Signed)
Continue your current medications  Walk 30 minutes daily  Return in 3 months for followup  A1c one week prior

## 2013-03-15 ENCOUNTER — Telehealth: Payer: Self-pay

## 2013-03-15 NOTE — Telephone Encounter (Signed)
Relevant patient education mailed to patient.  

## 2013-04-06 ENCOUNTER — Encounter: Payer: 59 | Admitting: Family Medicine

## 2013-04-07 ENCOUNTER — Encounter: Payer: Self-pay | Admitting: Family Medicine

## 2013-04-07 ENCOUNTER — Ambulatory Visit (INDEPENDENT_AMBULATORY_CARE_PROVIDER_SITE_OTHER): Payer: 59 | Admitting: Family Medicine

## 2013-04-07 VITALS — BP 120/80 | HR 98 | Temp 97.5°F | Wt 284.0 lb

## 2013-04-07 DIAGNOSIS — B349 Viral infection, unspecified: Secondary | ICD-10-CM

## 2013-04-07 DIAGNOSIS — B9789 Other viral agents as the cause of diseases classified elsewhere: Secondary | ICD-10-CM

## 2013-04-07 NOTE — Patient Instructions (Signed)
Acute Bronchitis Bronchitis is inflammation of the airways that extend from the windpipe into the lungs (bronchi). The inflammation often causes mucus to develop. This leads to a cough, which is the most common symptom of bronchitis.  In acute bronchitis, the condition usually develops suddenly and goes away over time, usually in a couple weeks. Smoking, allergies, and asthma can make bronchitis worse. Repeated episodes of bronchitis may cause further lung problems.  CAUSES Acute bronchitis is most often caused by the same virus that causes a cold. The virus can spread from person to person (contagious).  SIGNS AND SYMPTOMS   Cough.   Fever.   Coughing up mucus.   Body aches.   Chest congestion.   Chills.   Shortness of breath.   Sore throat.  DIAGNOSIS  Acute bronchitis is usually diagnosed through a physical exam. Tests, such as chest X-rays, are sometimes done to rule out other conditions.  TREATMENT  Acute bronchitis usually goes away in a couple weeks. Often times, no medical treatment is necessary. Medicines are sometimes given for relief of fever or cough. Antibiotics are usually not needed but may be prescribed in certain situations. In some cases, an inhaler may be recommended to help reduce shortness of breath and control the cough. A cool mist vaporizer may also be used to help thin bronchial secretions and make it easier to clear the chest.  HOME CARE INSTRUCTIONS  Get plenty of rest.   Drink enough fluids to keep your urine clear or pale yellow (unless you have a medical condition that requires fluid restriction). Increasing fluids may help thin your secretions and will prevent dehydration.   Only take over-the-counter or prescription medicines as directed by your health care provider.   Avoid smoking and secondhand smoke. Exposure to cigarette smoke or irritating chemicals will make bronchitis worse. If you are a smoker, consider using nicotine gum or skin  patches to help control withdrawal symptoms. Quitting smoking will help your lungs heal faster.   Reduce the chances of another bout of acute bronchitis by washing your hands frequently, avoiding people with cold symptoms, and trying not to touch your hands to your mouth, nose, or eyes.   Follow up with your health care provider as directed.  SEEK MEDICAL CARE IF: Your symptoms do not improve after 1 week of treatment.  SEEK IMMEDIATE MEDICAL CARE IF:  You develop an increased fever or chills.   You have chest pain.   You have severe shortness of breath.  You have bloody sputum.   You develop dehydration.  You develop fainting.  You develop repeated vomiting.  You develop a severe headache. MAKE SURE YOU:   Understand these instructions.  Will watch your condition.  Will get help right away if you are not doing well or get worse. Document Released: 04/02/2004 Document Revised: 10/26/2012 Document Reviewed: 08/16/2012 Exit[employer] Patient Information 2014 [employer].  

## 2013-04-07 NOTE — Progress Notes (Signed)
   Subjective:    Patient ID: Nicholas [name], male    DOB: [birth year], 43 y.o.   MRN: [id number]  HPI Patient seen with dry cough, nasal congestion and sore throat. He describes illness was started last Saturday. He had fever Saturday to Monday along with body aches and symptoms above. His fever seemed to resolve her on Tuesday. Cough has been mostly nonproductive. It improved with over-the-counter cough drops. He denies any dyspnea. No wheezing. Nasal congestion relatively mild. Sore throat symptoms are worse in the morning. No nausea or vomiting. Quit smoking about 5 years ago.  Past Medical History  Diagnosis Date  . Diabetes mellitus    No past surgical history on file.  reports that he quit smoking about 6 years ago. He has never used smokeless tobacco. He reports that he does not drink alcohol or use illicit drugs. family history is not on file. Allergies  Allergen Reactions  . Penicillins       Review of Systems  Constitutional: Negative for fever and chills.  HENT: Positive for congestion and sore throat.   Respiratory: Positive for cough.        Objective:   Physical Exam  Constitutional: He appears well-developed and well-nourished.  HENT:  Right Ear: External ear normal.  Left Ear: External ear normal.  Mouth/Throat: Oropharynx is clear and moist.  Neck: Neck supple. No thyromegaly present.  Cardiovascular: Normal rate.   Pulmonary/Chest: Effort normal and breath sounds normal. No respiratory distress. He has no wheezes. He has no rales.  Lymphadenopathy:    He has no cervical adenopathy.          Assessment & Plan:  Viral syndrome. Suspect this is resolving. He has nonfocal exam. Continue over-the-counter medications as needed. No indication for antibiotics at this time. Followup for any recurrent fever or worsening symptoms

## 2013-04-07 NOTE — Progress Notes (Signed)
Pre visit review using our clinic review tool, if applicable. No additional management support is needed unless otherwise documented below in the visit note. 

## 2013-06-05 ENCOUNTER — Other Ambulatory Visit: Payer: 59

## 2013-06-12 ENCOUNTER — Ambulatory Visit: Payer: 59 | Admitting: Family Medicine

## 2013-07-27 ENCOUNTER — Other Ambulatory Visit: Payer: 59

## 2013-08-03 ENCOUNTER — Ambulatory Visit: Payer: 59 | Admitting: Family Medicine

## 2013-08-03 ENCOUNTER — Other Ambulatory Visit: Payer: Self-pay | Admitting: Family Medicine

## 2013-08-30 ENCOUNTER — Ambulatory Visit: Payer: 59 | Admitting: Family Medicine

## 2013-08-30 ENCOUNTER — Other Ambulatory Visit: Payer: 59

## 2013-09-04 ENCOUNTER — Ambulatory Visit: Payer: 59 | Admitting: Family Medicine

## 2013-09-25 ENCOUNTER — Other Ambulatory Visit: Payer: Self-pay | Admitting: Family Medicine

## 2013-09-25 DIAGNOSIS — E785 Hyperlipidemia, unspecified: Secondary | ICD-10-CM

## 2013-09-25 DIAGNOSIS — E1165 Type 2 diabetes mellitus with hyperglycemia: Secondary | ICD-10-CM

## 2013-09-25 DIAGNOSIS — IMO0002 Reserved for concepts with insufficient information to code with codable children: Secondary | ICD-10-CM

## 2013-10-17 ENCOUNTER — Other Ambulatory Visit: Payer: 59

## 2013-10-23 ENCOUNTER — Ambulatory Visit: Payer: 59 | Admitting: Family Medicine

## 2013-11-06 ENCOUNTER — Other Ambulatory Visit (INDEPENDENT_AMBULATORY_CARE_PROVIDER_SITE_OTHER): Payer: BC Managed Care – PPO

## 2013-11-06 DIAGNOSIS — R7989 Other specified abnormal findings of blood chemistry: Secondary | ICD-10-CM

## 2013-11-06 DIAGNOSIS — E785 Hyperlipidemia, unspecified: Secondary | ICD-10-CM

## 2013-11-06 DIAGNOSIS — IMO0001 Reserved for inherently not codable concepts without codable children: Secondary | ICD-10-CM

## 2013-11-06 DIAGNOSIS — E1165 Type 2 diabetes mellitus with hyperglycemia: Secondary | ICD-10-CM

## 2013-11-06 DIAGNOSIS — IMO0002 Reserved for concepts with insufficient information to code with codable children: Secondary | ICD-10-CM

## 2013-11-06 LAB — BASIC METABOLIC PANEL
BUN: 13 mg/dL (ref 6–23)
CALCIUM: 9.2 mg/dL (ref 8.4–10.5)
CO2: 26 mEq/L (ref 19–32)
Chloride: 102 mEq/L (ref 96–112)
Creatinine, Ser: 0.9 mg/dL (ref 0.4–1.5)
GFR: 101.61 mL/min (ref >60.00)
Glucose, Bld: 153 mg/dL — ABNORMAL HIGH (ref 70–99)
Potassium: 4.4 mEq/L (ref 3.5–5.1)
Sodium: 138 mEq/L (ref 135–145)

## 2013-11-06 LAB — MICROALBUMIN / CREATININE URINE RATIO
Creatinine,U: 147.1 mg/dL
MICROALB UR: 1.8 mg/dL (ref 0.0–1.9)
Microalb Creat Ratio: 1.2 mg/g (ref 0.0–30.0)

## 2013-11-06 LAB — LIPID PANEL
CHOL/HDL RATIO: 6
Cholesterol: 205 mg/dL — ABNORMAL HIGH (ref 0–200)
HDL: 33.3 mg/dL — AB (ref >39.00)
NONHDL: 171.7
TRIGLYCERIDES: 341 mg/dL — AB (ref 0.0–149.0)
VLDL: 68.2 mg/dL — AB (ref 0.0–40.0)

## 2013-11-06 LAB — HEMOGLOBIN A1C: HEMOGLOBIN A1C: 7.6 % — AB (ref 4.6–6.5)

## 2013-11-06 LAB — LDL CHOLESTEROL, DIRECT: Direct LDL: 142.8 mg/dL

## 2013-11-14 ENCOUNTER — Ambulatory Visit (INDEPENDENT_AMBULATORY_CARE_PROVIDER_SITE_OTHER): Payer: BC Managed Care – PPO | Admitting: Family Medicine

## 2013-11-14 ENCOUNTER — Encounter: Payer: Self-pay | Admitting: Family Medicine

## 2013-11-14 VITALS — BP 110/70 | Temp 98.0°F | Wt 273.0 lb

## 2013-11-14 DIAGNOSIS — IMO0002 Reserved for concepts with insufficient information to code with codable children: Secondary | ICD-10-CM

## 2013-11-14 DIAGNOSIS — IMO0001 Reserved for inherently not codable concepts without codable children: Secondary | ICD-10-CM

## 2013-11-14 DIAGNOSIS — E785 Hyperlipidemia, unspecified: Secondary | ICD-10-CM

## 2013-11-14 DIAGNOSIS — E1165 Type 2 diabetes mellitus with hyperglycemia: Secondary | ICD-10-CM

## 2013-11-14 MED ORDER — SIMVASTATIN 10 MG PO TABS: 10.0000 mg | ORAL_TABLET | Freq: Every day | ORAL | Status: DC

## 2013-11-14 MED ORDER — ONETOUCH ULTRASOFT LANCETS MISC: Status: DC

## 2013-11-14 MED ORDER — METFORMIN HCL 1000 MG PO TABS: ORAL_TABLET | ORAL | Status: DC

## 2013-11-14 MED ORDER — LISINOPRIL 5 MG PO TABS: ORAL_TABLET | ORAL | Status: DC

## 2013-11-14 MED ORDER — GLUCOSE BLOOD VI STRP: ORAL_STRIP | Status: DC

## 2013-11-14 NOTE — Patient Instructions (Signed)
Metformin 1000 mg...........Marland Kitchen 1 tab in the morning...Marland KitchenMarland KitchenMarland Kitchen half a tablet prior to evening male  Zocor 10 mg.......... one tablet at bedtime along with an aspirin tablet  Stopped the Janumet  Return in 2 months for followup  Labs one week prior  Worked very hard the next 3 months with diet and exercise and weight loss.

## 2013-11-14 NOTE — Progress Notes (Signed)
   Subjective:    Patient ID: Nicholas [name], male    DOB: 17-Oct-1970, 43 y.o.   MRN: [id number]  HPI Nicholas [name] is a 43 year old male nonsmoker who comes in today for followup of diabetes type 2. He's been on Janumet however his A1c is 7.6%. He can't tolerate the combination of glipizide and metformin  BP is 110/70 on Zestril 5 mg daily  LDL cholesterol 143. No protein in hearing   Review of Systems Review of systems otherwise negative    Objective:   Physical Exam Well-developed well nourished male no acute distress weight up to 273 pounds vital signs otherwise stabl..........Marland Kitchen 110/70         Assessment & Plan:  Diabetes type 2....... not at goal.......Marland Kitchen DC the Janumet...........Marland Kitchen go to plain metformin 1000 mg the morning 500 mg in the evening followup A1c in 3 months  Hyperlipidemia not at goal..........Marland Kitchen Zocor 20 mg and an aspirin tablet at bedtime........ followup lipid and liver panel in 2 months  Hypertension at goal continue lisinopril 5 mg daily

## 2013-11-14 NOTE — Progress Notes (Signed)
Pre visit review using our clinic review tool, if applicable. No additional management support is needed unless otherwise documented below in the visit note. Lab Results  Component Value Date   HGBA1C 7.6* 11/06/2013   HGBA1C 7.5* 10/18/2012   HGBA1C 7.5* 12/22/2011   Lab Results  Component Value Date   MICROALBUR 1.8 11/06/2013   CREATININE 0.9 11/06/2013

## 2013-12-05 ENCOUNTER — Other Ambulatory Visit: Payer: Self-pay | Admitting: *Deleted

## 2013-12-05 DIAGNOSIS — IMO0002 Reserved for concepts with insufficient information to code with codable children: Secondary | ICD-10-CM

## 2013-12-05 DIAGNOSIS — E1165 Type 2 diabetes mellitus with hyperglycemia: Secondary | ICD-10-CM

## 2013-12-05 MED ORDER — METFORMIN HCL 1000 MG PO TABS: 1000.0000 mg | ORAL_TABLET | Freq: Every day | ORAL | Status: DC

## 2013-12-05 MED ORDER — LISINOPRIL 5 MG PO TABS: ORAL_TABLET | ORAL | Status: DC

## 2013-12-05 MED ORDER — METFORMIN HCL 500 MG PO TABS: 500.0000 mg | ORAL_TABLET | Freq: Every day | ORAL | Status: DC

## 2013-12-25 ENCOUNTER — Telehealth: Payer: Self-pay | Admitting: Family Medicine

## 2013-12-25 DIAGNOSIS — E785 Hyperlipidemia, unspecified: Secondary | ICD-10-CM

## 2013-12-25 MED ORDER — SIMVASTATIN 10 MG PO TABS: 10.0000 mg | ORAL_TABLET | Freq: Every day | ORAL | Status: DC

## 2013-12-25 NOTE — Telephone Encounter (Signed)
Pt request refill of the following:  simvastatin (ZOCOR) 10 MG tablet  Pt said he has not received the above rx     [facility]

## 2014-01-15 ENCOUNTER — Other Ambulatory Visit: Payer: BC Managed Care – PPO

## 2014-01-23 ENCOUNTER — Ambulatory Visit: Payer: BC Managed Care – PPO | Admitting: Family Medicine

## 2014-02-20 ENCOUNTER — Ambulatory Visit: Payer: BC Managed Care – PPO | Admitting: Family Medicine

## 2014-02-23 ENCOUNTER — Other Ambulatory Visit: Payer: BC Managed Care – PPO

## 2014-03-12 ENCOUNTER — Telehealth: Payer: Self-pay | Admitting: Family Medicine

## 2014-03-12 DIAGNOSIS — E1165 Type 2 diabetes mellitus with hyperglycemia: Secondary | ICD-10-CM

## 2014-03-12 DIAGNOSIS — IMO0002 Reserved for concepts with insufficient information to code with codable children: Secondary | ICD-10-CM

## 2014-03-12 DIAGNOSIS — E785 Hyperlipidemia, unspecified: Secondary | ICD-10-CM

## 2014-03-12 NOTE — Telephone Encounter (Signed)
Patient is scheduled for lab work on 05/16/14, can you please re-enter the orders? Thanks

## 2014-03-12 NOTE — Telephone Encounter (Signed)
Labs ordered.

## 2014-03-14 ENCOUNTER — Ambulatory Visit: Payer: BC Managed Care – PPO | Admitting: Family Medicine

## 2014-05-16 ENCOUNTER — Other Ambulatory Visit: Payer: Self-pay

## 2014-05-21 ENCOUNTER — Ambulatory Visit: Payer: Self-pay | Admitting: Family Medicine

## 2014-08-01 ENCOUNTER — Other Ambulatory Visit: Payer: Self-pay

## 2014-08-07 ENCOUNTER — Ambulatory Visit: Payer: Self-pay | Admitting: Family Medicine

## 2014-09-17 ENCOUNTER — Other Ambulatory Visit: Payer: Self-pay

## 2014-11-08 ENCOUNTER — Other Ambulatory Visit: Payer: Self-pay

## 2014-11-19 ENCOUNTER — Ambulatory Visit: Payer: Self-pay | Admitting: Family Medicine

## 2014-12-17 ENCOUNTER — Other Ambulatory Visit: Payer: Self-pay | Admitting: Family Medicine

## 2014-12-19 ENCOUNTER — Encounter: Payer: Self-pay | Admitting: Family Medicine

## 2014-12-19 ENCOUNTER — Ambulatory Visit (INDEPENDENT_AMBULATORY_CARE_PROVIDER_SITE_OTHER): Payer: BLUE CROSS/BLUE SHIELD | Admitting: Family Medicine

## 2014-12-19 VITALS — BP 128/88 | HR 93 | Temp 98.1°F | Ht 71.0 in | Wt 268.7 lb

## 2014-12-19 DIAGNOSIS — M25512 Pain in left shoulder: Secondary | ICD-10-CM | POA: Diagnosis not present

## 2014-12-19 MED ORDER — TRAMADOL HCL 50 MG PO TABS: ORAL_TABLET | ORAL | Status: DC

## 2014-12-19 NOTE — Progress Notes (Signed)
   Subjective:    Patient ID: Nicholas [name], male    DOB: [birth year], 44 y.o.   MRN: [id number]  HPI Nicholas [name] is a 44 year old married male nonsmoker who comes in today for evaluation of left shoulder pain for 10 years  About 10 years ago he began having episodes of severe left shoulder pain. He would exercise it and take some Motrin and it would go away. Over the past year it's gotten worse. Recently he's had severe pain and he's been taken 800 mg of Motrin twice daily with no relief.  He has no history of trauma. He is right-handed.   Review of Systems    review of systems otherwise negative except he is due to come in next Monday for follow-up his diabetes,,,,,,,,,, he was actually due to come back 3 months ago but he never returned. His last A1c a year ago was 7.6% Objective:   Physical Exam  Well-developed well-nourished male no acute distress vital signs stable he is afebrile examination of the shoulder shows fairly good range of motion in all 3 quadrants. There is tenderness in the posterior shoulder to palpation but no obvious deformity      Assessment & Plan:  Pain left shoulder etiology unknown.......Marland Kitchen. Motrin 400 twice a day..... Tramadol 50 mg......Marland Kitchen. 1/2-1 tablet twice a day,,,,,,, follow-up with orthopedist ASAP Dr. Cleophas DunkerWhitfield

## 2014-12-19 NOTE — Patient Instructions (Signed)
Motrin 400 mg twice daily with food  Tramadol 50 mg,,,,,,,,, 1/2-1 tablet twice daily for pain  Ice packs on that left shoulder when your home in the evening  Call Dr. Norlene CampbellPeter Whitfield orthopedist and asked to be seen ASAP

## 2014-12-21 ENCOUNTER — Telehealth: Payer: Self-pay | Admitting: Family Medicine

## 2014-12-21 NOTE — Telephone Encounter (Signed)
Spoke with Dr. Durene CalHunter and he advised patient to take 100 mg Tramadol until patient is seen by Ortho on Monday. In addition, 100 mg is only for the weekend, to call office Monday and let Dr. Tawanna Coolerodd know if 100 mg is giving relief, if he wants to change medication, or change dosage. Patient verbalized understanding and will call office Monday.

## 2014-12-21 NOTE — Telephone Encounter (Signed)
[facility] - Client TELEPHONE ADVICE RECORD [facility] Patient Name: Nicholas HackerJEREMI [name] DOB: 31-Jul-1970 Initial Comment Caller states he was seen Wednesday for shoulder pain, medication rx has not helped. Called this morning and spoke to a nurse about medication change, has not heard back. Nurse Assessment Nurse: Roderic OvensNorth, RN, Amy Date/Time Lamount Cohen(Eastern Time): 12/21/2014 12:59:16 PM Confirm and document reason for call. If symptomatic, describe symptoms. ---HE CALLED THIS MORNING AND HE TALKED WITH SOMEONE. SHOULDER PAIN ON WEDNESDAY HE SAW DR. TODD AND WAS TOLD THAT IT WAS JOINT RELATED AND HE IS NOT SURE WHICH. HE HAS AN APPT ON MONDAY ON WITH ORTHO. HE STATES THAT THE TRAMADOL IS ORDERED 50 MG BID. HE STATES THAT THE MEDICATION IMPROVEMENT IS ONLY MINOR. IBUPROFEN 400 MG AS WELL IN BETWEEN THE DOSES OF THE TRAMADOL. ICE PACK HAS ALSO BEEN RECOMMENDED AS WELL. HE'S HAD MINOR SHOULDER PROBLEMS FOR THE PAST 10 YEARS. NO KNOWN INJURY. Has the patient traveled out of the country within the last 30 days? ---Not Applicable Does the patient have any new or worsening symptoms? ---Yes Will a triage be completed? ---Yes Related visit to physician within the last 2 weeks? ---Yes Does the PT have any chronic conditions? (i.e. diabetes, asthma, etc.) ---Yes List chronic conditions. ---DIABETES - METFORMIN, LISINOPRIL - KIDNEY PROTECTOR, CHOLESTEROL - SIMVASTATIN Guidelines Guideline Title Affirmed Question Affirmed Notes Arm Injury [1] SEVERE pain AND [2] not improved 2 hours after pain medicine/ice packs Final Disposition User See Physician within 4 Hours (or PCP triage) [place], RN, Amy Comments CALLER STATES THAT HE CAN NOT DRIVE DUE TO THE PAIN. HE STATES THAT HE IS WANTING TO KNOW IF HE CAN GET THE MEDICATION ADJUSTED. HE WOULD LIKE TO KNOW IF HE CAN GET THE TRAMADOL ADJUSTED FROM THE 50 MG BID TO 100 MG BID. SINCE DR. TOOD IS NOT THERE TODAY  IF SOMEONE CAN PLEASE ADDRESS THIS FOR HIM AND CALL HIM BACK WITH THIS INFORMATION. INSTRUCTED HIM THAT CAN NOT PLEASE NOTE: All timestamps contained within this report are represented as [place] Standard Time. CONFIDENTIALTY NOTICE: This fax transmission is intended only for the addressee. It contains information that is legally privileged, confidential or otherwise protected from use or disclosure. If you are not the intended recipient, you are strictly prohibited from reviewing, disclosing, copying using or disseminating any of this information or taking any action in reliance on or regarding this information. If you have received this fax in error, please notify us immediately by telephone so that we can arrange for its return to us. Phone: [phone], Toll-Free: [phone], Fax: [phone] Page: 2 of 2 Call Id: [id number] Comments TELL HIM IT IS OKAY TO DO SO WITH THE INCREASE IN THE MEDICATION AS JUST RN. Referrals REFERRED TO PCP OFFICE Disagree/Comply: Comply

## 2014-12-21 NOTE — Telephone Encounter (Signed)
Pt called back wanting an answer by now. Again, advised Dr Tawanna Coolerodd not in the office, but would let him speak with a nurse.

## 2014-12-21 NOTE — Telephone Encounter (Addendum)
Pt states he saw Dr Tawanna Coolerodd for shoulder pain. Pt states he still has shoulder pain and states the tramadol prescribed is not working. Pt would like to know if OK to double the traMADol (ULTRAM) 50 MG tablet Or prescribe something else Advised pt Dr Tawanna Coolerodd not in the office , but would send a message. Pt verbalized understanding. Pt has appointment with orthopedic on Monday but needs to get through weekend.

## 2014-12-24 ENCOUNTER — Other Ambulatory Visit (INDEPENDENT_AMBULATORY_CARE_PROVIDER_SITE_OTHER): Payer: BLUE CROSS/BLUE SHIELD

## 2014-12-24 DIAGNOSIS — E1165 Type 2 diabetes mellitus with hyperglycemia: Secondary | ICD-10-CM

## 2014-12-24 DIAGNOSIS — IMO0002 Reserved for concepts with insufficient information to code with codable children: Secondary | ICD-10-CM

## 2014-12-24 DIAGNOSIS — E785 Hyperlipidemia, unspecified: Secondary | ICD-10-CM

## 2014-12-24 LAB — HEPATIC FUNCTION PANEL
ALK PHOS: 65 U/L (ref 39–117)
ALT: 57 U/L — ABNORMAL HIGH (ref 0–53)
AST: 38 U/L — AB (ref 0–37)
Albumin: 4.2 g/dL (ref 3.5–5.2)
BILIRUBIN DIRECT: 0.2 mg/dL (ref 0.0–0.3)
Total Bilirubin: 1.2 mg/dL (ref 0.2–1.2)
Total Protein: 7.4 g/dL (ref 6.0–8.3)

## 2014-12-24 LAB — BASIC METABOLIC PANEL
BUN: 14 mg/dL (ref 6–23)
CALCIUM: 9.8 mg/dL (ref 8.4–10.5)
CHLORIDE: 99 meq/L (ref 96–112)
CO2: 29 mEq/L (ref 19–32)
Creatinine, Ser: 0.92 mg/dL (ref 0.40–1.50)
GFR: 94.77 mL/min (ref >60.00)
GLUCOSE: 176 mg/dL — AB (ref 70–99)
POTASSIUM: 4.8 meq/L (ref 3.5–5.1)
SODIUM: 138 meq/L (ref 135–145)

## 2014-12-24 LAB — HEMOGLOBIN A1C: Hgb A1c MFr Bld: 7.8 % — ABNORMAL HIGH (ref 4.6–6.5)

## 2014-12-24 LAB — LDL CHOLESTEROL, DIRECT: Direct LDL: 108 mg/dL

## 2014-12-24 LAB — LIPID PANEL
Cholesterol: 177 mg/dL (ref 0–200)
HDL: 37.5 mg/dL — AB (ref >39.00)
NonHDL: 139.75
Total CHOL/HDL Ratio: 5
Triglycerides: 273 mg/dL — ABNORMAL HIGH (ref 0.0–149.0)
VLDL: 54.6 mg/dL — ABNORMAL HIGH (ref 0.0–40.0)

## 2014-12-24 NOTE — Telephone Encounter (Signed)
Dr Tawanna Coolerodd spoke with Dr Lessie DingsWhitefield and patient should increase his metformin 1000 mg twice daily while taking prednisone.

## 2014-12-31 ENCOUNTER — Encounter: Payer: Self-pay | Admitting: Family Medicine

## 2014-12-31 ENCOUNTER — Ambulatory Visit (INDEPENDENT_AMBULATORY_CARE_PROVIDER_SITE_OTHER): Payer: BLUE CROSS/BLUE SHIELD | Admitting: Family Medicine

## 2014-12-31 VITALS — BP 110/80 | Temp 98.3°F | Wt 261.0 lb

## 2014-12-31 DIAGNOSIS — IMO0001 Reserved for inherently not codable concepts without codable children: Secondary | ICD-10-CM

## 2014-12-31 DIAGNOSIS — E1165 Type 2 diabetes mellitus with hyperglycemia: Secondary | ICD-10-CM

## 2014-12-31 MED ORDER — METFORMIN HCL 1000 MG PO TABS: 1000.0000 mg | ORAL_TABLET | Freq: Two times a day (BID) | ORAL | Status: DC

## 2014-12-31 NOTE — Progress Notes (Signed)
Pre visit review using our clinic review tool, if applicable. No additional management support is needed unless otherwise documented below in the visit note. Lab Results  Component Value Date   HGBA1C 7.8* 12/24/2014   HGBA1C 7.6* 11/06/2013   HGBA1C 7.5* 10/18/2012   Lab Results  Component Value Date   MICROALBUR 1.8 11/06/2013   CREATININE 0.92 12/24/2014

## 2014-12-31 NOTE — Progress Notes (Signed)
   Subjective:    Patient ID: Nicholas [name], male    DOB: 1971/02/10, 44 y.o.   MRN: [id number]  HPI Nicholas [name] is a 44 year old male who comes in today for follow-up of diabetes  He's not been very compliant with his diet. He been traveling a lot the last couple months. A1c week ago was 7.8%. His LDL on 10 mg of Zocor was 132108. Goal less than 75. He also has some slight elevation of his LFTs which are probably related to Zocor but they're not significant. However we'll keep the dose at 10 mg for now.   Review of Systems Review of systems otherwise negative    Objective:   Physical Exam Well-developed well-nourished male no acute distress vital signs stable he is afebrile       Assessment & Plan:  Diabetes type 2 not at goal....... increase metformin to thousand milligrams twice a day........ stressed daily exercise.........Marland Kitchen. Diet............ follow-up in 3 months

## 2014-12-31 NOTE — Patient Instructions (Signed)
Metformin 1000 mg.......... one before breakfast and another tablet before your evening meal  Drink lots of water  Strict diet........... no carbohydrates  Walk 30 minutes daily  Follow-up A1c in 3 months..... Nonfasting  See me a week after labs for follow-up

## 2015-01-07 ENCOUNTER — Other Ambulatory Visit: Payer: Self-pay | Admitting: Orthopaedic Surgery

## 2015-01-07 DIAGNOSIS — M545 Low back pain: Secondary | ICD-10-CM

## 2015-01-14 ENCOUNTER — Ambulatory Visit
Admission: RE | Admit: 2015-01-14 | Discharge: 2015-01-14 | Disposition: A | Discharge status: Home / Self Care | Payer: BLUE CROSS/BLUE SHIELD | Source: Ambulatory Visit | Attending: Orthopaedic Surgery | Admitting: Orthopaedic Surgery

## 2015-01-14 DIAGNOSIS — M545 Low back pain: Secondary | ICD-10-CM

## 2015-01-15 ENCOUNTER — Other Ambulatory Visit: Payer: Self-pay | Admitting: Orthopaedic Surgery

## 2015-01-15 DIAGNOSIS — M542 Cervicalgia: Secondary | ICD-10-CM

## 2015-01-17 ENCOUNTER — Ambulatory Visit
Admission: RE | Admit: 2015-01-17 | Discharge: 2015-01-17 | Disposition: A | Discharge status: Home / Self Care | Payer: BLUE CROSS/BLUE SHIELD | Source: Ambulatory Visit | Attending: Orthopaedic Surgery | Admitting: Orthopaedic Surgery

## 2015-01-17 DIAGNOSIS — M542 Cervicalgia: Secondary | ICD-10-CM

## 2015-03-29 ENCOUNTER — Other Ambulatory Visit: Payer: Self-pay | Admitting: Family Medicine

## 2015-04-12 ENCOUNTER — Other Ambulatory Visit: Payer: Self-pay | Admitting: Family Medicine

## 2015-04-12 DIAGNOSIS — IMO0001 Reserved for inherently not codable concepts without codable children: Secondary | ICD-10-CM

## 2015-04-12 DIAGNOSIS — E1165 Type 2 diabetes mellitus with hyperglycemia: Principal | ICD-10-CM

## 2015-04-15 ENCOUNTER — Other Ambulatory Visit: Payer: BLUE CROSS/BLUE SHIELD

## 2015-04-23 ENCOUNTER — Ambulatory Visit: Payer: BLUE CROSS/BLUE SHIELD | Admitting: Family Medicine

## 2015-05-13 ENCOUNTER — Other Ambulatory Visit: Payer: Self-pay | Admitting: Family Medicine

## 2015-05-28 ENCOUNTER — Other Ambulatory Visit: Payer: BLUE CROSS/BLUE SHIELD

## 2015-06-04 ENCOUNTER — Ambulatory Visit: Payer: BLUE CROSS/BLUE SHIELD | Admitting: Family Medicine

## 2015-07-08 ENCOUNTER — Other Ambulatory Visit (INDEPENDENT_AMBULATORY_CARE_PROVIDER_SITE_OTHER): Payer: BLUE CROSS/BLUE SHIELD

## 2015-07-08 DIAGNOSIS — IMO0001 Reserved for inherently not codable concepts without codable children: Secondary | ICD-10-CM

## 2015-07-08 DIAGNOSIS — E1165 Type 2 diabetes mellitus with hyperglycemia: Secondary | ICD-10-CM | POA: Diagnosis not present

## 2015-07-08 LAB — BASIC METABOLIC PANEL
BUN: 14 mg/dL (ref 6–23)
CALCIUM: 9.6 mg/dL (ref 8.4–10.5)
CO2: 27 mEq/L (ref 19–32)
Chloride: 100 mEq/L (ref 96–112)
Creatinine, Ser: 0.89 mg/dL (ref 0.40–1.50)
GFR: 98.22 mL/min (ref >60.00)
Glucose, Bld: 244 mg/dL — ABNORMAL HIGH (ref 70–99)
POTASSIUM: 5 meq/L (ref 3.5–5.1)
Sodium: 137 mEq/L (ref 135–145)

## 2015-07-08 LAB — MICROALBUMIN / CREATININE URINE RATIO
CREATININE, U: 147.6 mg/dL
MICROALB UR: 3.5 mg/dL — AB (ref 0.0–1.9)
MICROALB/CREAT RATIO: 2.4 mg/g (ref 0.0–30.0)

## 2015-07-08 LAB — HEMOGLOBIN A1C: Hgb A1c MFr Bld: 7.9 % — ABNORMAL HIGH (ref 4.6–6.5)

## 2015-07-12 ENCOUNTER — Other Ambulatory Visit: Payer: Self-pay | Admitting: Family Medicine

## 2015-07-12 DIAGNOSIS — E119 Type 2 diabetes mellitus without complications: Secondary | ICD-10-CM

## 2015-07-15 ENCOUNTER — Ambulatory Visit: Payer: BLUE CROSS/BLUE SHIELD | Admitting: Family Medicine

## 2015-08-09 ENCOUNTER — Other Ambulatory Visit: Payer: Self-pay | Admitting: Family Medicine

## 2015-09-03 ENCOUNTER — Ambulatory Visit: Payer: BLUE CROSS/BLUE SHIELD | Admitting: Internal Medicine

## 2015-09-12 ENCOUNTER — Other Ambulatory Visit: Payer: Self-pay | Admitting: *Deleted

## 2015-09-12 MED ORDER — GLUCOSE BLOOD VI STRP: ORAL_STRIP | Status: DC

## 2015-10-14 ENCOUNTER — Encounter: Payer: Self-pay | Admitting: Internal Medicine

## 2015-10-14 ENCOUNTER — Ambulatory Visit (INDEPENDENT_AMBULATORY_CARE_PROVIDER_SITE_OTHER): Payer: BLUE CROSS/BLUE SHIELD | Admitting: Internal Medicine

## 2015-10-14 VITALS — BP 124/90 | HR 88 | Ht 71.0 in | Wt 264.0 lb

## 2015-10-14 DIAGNOSIS — E1165 Type 2 diabetes mellitus with hyperglycemia: Secondary | ICD-10-CM | POA: Diagnosis not present

## 2015-10-14 DIAGNOSIS — E1142 Type 2 diabetes mellitus with diabetic polyneuropathy: Secondary | ICD-10-CM

## 2015-10-14 LAB — POCT GLYCOSYLATED HEMOGLOBIN (HGB A1C): Hemoglobin A1C: 7.2

## 2015-10-14 MED ORDER — METFORMIN HCL ER 500 MG PO TB24: ORAL_TABLET | ORAL | 3 refills | Status: DC

## 2015-10-14 MED ORDER — SITAGLIPTIN PHOSPHATE 100 MG PO TABS: 100.0000 mg | ORAL_TABLET | Freq: Every day | ORAL | 2 refills | Status: DC

## 2015-10-14 NOTE — Addendum Note (Signed)
Addended by: Darene LamerHOMPSON, Damarea Merkel T on: 10/14/2015 12:55 PM   Modules accepted: Orders

## 2015-10-14 NOTE — Patient Instructions (Signed)
Please stop the regular metformin and start Metformin ER 2000 mg with dinner.  Please add Januvia 100 mg before the first meal of the day.  Please let me know if the sugars are consistently <80 or >200.  Please return in 1.5 months with your sugar log.   PATIENT INSTRUCTIONS FOR TYPE 2 DIABETES:  **Please join MyChart!** - see attached instructions about how to join if you have not done so already.  DIET AND EXERCISE Diet and exercise is an important part of diabetic treatment.  We recommended aerobic exercise in the form of brisk walking (working between 40-60% of maximal aerobic capacity, similar to brisk walking) for 150 minutes per week (such as 30 minutes five days per week) along with 3 times per week performing 'resistance' training (using various gauge rubber tubes with handles) 5-10 exercises involving the major muscle groups (upper body, lower body and core) performing 10-15 repetitions (or near fatigue) each exercise. Start at half the above goal but build slowly to reach the above goals. If limited by weight, joint pain, or disability, we recommend daily walking in a swimming pool with water up to waist to reduce pressure from joints while allow for adequate exercise.    BLOOD GLUCOSES Monitoring your blood glucoses is important for continued management of your diabetes. Please check your blood glucoses 2-4 times a day: fasting, before meals and at bedtime (you can rotate these measurements - e.g. one day check before the 3 meals, the next day check before 2 of the meals and before bedtime, etc.).   HYPOGLYCEMIA (low blood sugar) Hypoglycemia is usually a reaction to not eating, exercising, or taking too much insulin/ other diabetes drugs.  Symptoms include tremors, sweating, hunger, confusion, headache, etc. Treat IMMEDIATELY with 15 grams of Carbs: . 4 glucose tablets .  cup regular juice/soda . 2 tablespoons raisins . 4 teaspoons sugar . 1 tablespoon honey Recheck blood  glucose in 15 mins and repeat above if still symptomatic/blood glucose <100.  RECOMMENDATIONS TO REDUCE YOUR RISK OF DIABETIC COMPLICATIONS: * Take your prescribed MEDICATION(S) * Follow a DIABETIC diet: Complex carbs, fiber rich foods, (monounsaturated and polyunsaturated) fats * AVOID saturated/trans fats, high fat foods, >2,300 mg salt per day. * EXERCISE at least 5 times a week for 30 minutes or preferably daily.  * DO NOT SMOKE OR DRINK more than 1 drink a day. * Check your FEET every day. Do not wear tightfitting shoes. Contact us if you develop an ulcer * See your EYE doctor once a year or more if needed * Get a FLU shot once a year * Get a PNEUMONIA vaccine once before and once after age 45 years  GOALS:  * Your Hemoglobin A1c of <7%  * fasting sugars need to be <130 * after meals sugars need to be <180 (2h after you start eating) * Your Systolic BP should be 140 or lower  * Your Diastolic BP should be 80 or lower  * Your HDL (Good Cholesterol) should be 40 or higher  * Your LDL (Bad Cholesterol) should be 100 or lower. * Your Triglycerides should be 150 or lower  * Your Urine microalbumin (kidney function) should be <30 * Your Body Mass Index should be 25 or lower    Please consider the following ways to cut down carbs and fat and increase fiber and micronutrients in your diet: - substitute whole grain for white bread or pasta - substitute brown rice for white rice - substitute 90-calorie flat  bread pieces for slices of bread when possible - substitute sweet potatoes or yams for white potatoes - substitute humus for margarine - substitute tofu for cheese when possible - substitute almond or rice milk for regular milk (would not drink soy milk daily due to concern for soy estrogen influence on breast cancer risk) - substitute dark chocolate for other sweets when possible - substitute water - can add lemon or orange slices for taste - for diet sodas (artificial sweeteners  will trick your body that you can eat sweets without getting calories and will lead you to overeating and weight gain in the long run) - do not skip breakfast or other meals (this will slow down the metabolism and will result in more weight gain over time)  - can try smoothies made from fruit and almond/rice milk in am instead of regular breakfast - can also try old-fashioned (not instant) oatmeal made with almond/rice milk in am - order the dressing on the side when eating salad at a restaurant (pour less than half of the dressing on the salad) - eat as little meat as possible - can try juicing, but should not forget that juicing will get rid of the fiber, so would alternate with eating raw veg./fruits or drinking smoothies - use as little oil as possible, even when using olive oil - can dress a salad with a mix of balsamic vinegar and lemon juice, for e.g. - use agave nectar, stevia sugar, or regular sugar rather than artificial sweateners - steam or broil/roast veggies  - snack on veggies/fruit/nuts (unsalted, preferably) when possible, rather than processed foods - reduce or eliminate aspartame in diet (it is in diet sodas, chewing gum, etc) Read the labels!  Try to read Dr. Janene Harvey book: "Program for Reversing Diabetes" for other ideas for healthy eating.

## 2015-10-14 NOTE — Progress Notes (Signed)
Patient ID: [name], male   DOB: 1970/08/10, 45 y.o.   MRN: [id number]   HPI: Nicholas [name] is a 45 y.o.-year-old male, referred by his PCP, Dr. Tawanna [name], for management of DM2, dx in ~2012, non-insulin-dependent, uncontrolled, with complications (PN).  Last hemoglobin A1c was: Lab Results  Component Value Date   HGBA1C 7.9 (H) 07/08/2015   HGBA1C 7.8 (H) 12/24/2014   HGBA1C 7.6 (H) 11/06/2013  He was on a Prednisone taper for 7 days for neck pain >> 01/2015.  Pt is on a regimen of: - Metformin 1000 mg 2x a day, with meals - occasionally diarrhea, some nausea He was on Glipizide before >> had to stop b/c hypoglycemia.  Pt checks his sugars 0-1 a day and they are: - am: 175-200, 213  - 2h after b'fast: n/c - before lunch: 170s - 2h after lunch: n/c - before dinner: n/c - 2h after dinner: n/c - bedtime: n/c - nighttime: n/c No lows. Lowest sugar was 112; he has hypoglycemia awareness at 70.  Highest sugar was 200s.  Glucometer: Verio IQ  Pt's meals are: - Breakfast: skips usually - Lunch: sandwich/salad - Dinner: varies - Snacks: rarely, 1-2  - no CKD, last BUN/creatinine:  Lab Results  Component Value Date   BUN 14 07/08/2015   BUN 14 12/24/2014   CREATININE 0.89 07/08/2015   CREATININE 0.92 12/24/2014  On Lisinopril - last set of lipids: Lab Results  Component Value Date   CHOL 177 12/24/2014   HDL 37.50 (L) 12/24/2014   LDLDIRECT 108.0 12/24/2014   TRIG 273.0 (H) 12/24/2014   CHOLHDL 5 12/24/2014  On Simvastatin. - last eye exam was in 2014. No DR.  - + numbness (no tingling) in his feet.  Pt has FH of DM in father - DM1, grandfather.  ROS: Constitutional: no weight gain/loss, no fatigue, no subjective hyperthermia/hypothermia Eyes: no blurry vision, no xerophthalmia ENT: no sore throat, no nodules palpated in throat, no dysphagia/odynophagia, no hoarseness, + tinnitus Cardiovascular: no CP/SOB/palpitations/leg swelling Respiratory: no  cough/SOB Gastrointestinal: no N/V/D/C Musculoskeletal: + muscle/+ joint aches Skin: no rashes Neurological: no tremors/+ numbness/no tingling/dizziness Psychiatric: no depression/anxiety  Patient Active Problem List   Diagnosis Date Noted  . Left shoulder pain 12/19/2014  . Diabetes mellitus type II, uncontrolled (HCC) 06/05/2010  . Hyperlipidemia 03/06/2008  . PREMATURE VENTRICULAR CONTRACTIONS 11/07/2007   Past surgical history: - Appendectomy in 1982   Social History   Social History  . Marital status: Married    Spouse name: N/A  . Number of children: 1- Stepson    Occupational History  . Scientist    Social History Main Topics  . Smoking status: Former Smoker    Quit date: 03/10/2007  . Smokeless tobacco: Never Used  . Alcohol use No, 1 or 2 drinks per year   . Drug use: No   Current Outpatient Prescriptions on File Prior to Visit  Medication Sig Dispense Refill  . glucose blood (ONETOUCH VERIO) test strip Use once daily for glucose control, dx 250.00 100 each 3  . Lancets (ONETOUCH ULTRASOFT) lancets Use once daily for glucose control. Dx 250.00 100 each 12  . lisinopril (PRINIVIL,ZESTRIL) 5 MG tablet TAKE 1 TABLET DAILY 90 tablet 0  . metFORMIN (GLUCOPHAGE) 1000 MG tablet Take 1 tablet (1,000 mg total) by mouth 2 (two) times daily. 200 tablet 3  . simvastatin (ZOCOR) 10 MG tablet TAKE 1 TABLET AT BEDTIME 90 tablet 0  . traMADol (ULTRAM) 50 MG tablet 1/2-1  tablet twice daily as needed for severe shoulder pain (Patient not taking: Reported on 10/14/2015) 30 tablet 1   No current facility-administered medications on file prior to visit.    Allergies  Allergen Reactions  . Penicillins    Family history: - See history of present illness, also: - HTN and thyroid disease in grandfather   PE: BP 124/90 (BP Location: Left Arm, Patient Position: Sitting)   Pulse 88   Ht 5\' 11"  (1.803 m)   Wt 264 lb (119.7 kg)   SpO2 96%   BMI 36.82 kg/m  Wt Readings from Last 3  Encounters:  10/14/15 264 lb (119.7 kg)  12/31/14 261 lb (118.4 kg)  12/19/14 268 lb 11.2 oz (121.9 kg)   Constitutional: overweight, in NAD Eyes: PERRLA, EOMI, no exophthalmos ENT: moist mucous membranes, no thyromegaly, no cervical lymphadenopathy Cardiovascular: RRR, No MRG Respiratory: CTA B Gastrointestinal: abdomen soft, NT, ND, BS+ Musculoskeletal: no deformities, strength intact in all 4 Skin: moist, warm, no rashes Neurological: no tremor with outstretched hands, DTR normal in all 4  ASSESSMENT: 1. DM2, non-insulin-dependent, uncontrolled, with  Complications - PN  PLAN:  1. Patient with long-standing, uncontrolled diabetes, on oral antidiabetic regimen with metformin at target dose. He has occasional diarrhea with metformin, therefore, will switch to extended-release formulation. He does not check sugars often, but mentions that, whenever he checks, he is between 170 and 200s before meals and decreasing to 120s after meals. - We discussed about improving his diet, and I also suggested to a DPP 4 inhibitor to his regimen. I will try to send Januvia to his pharmacy to see if this is covered by his insurance. - I suggested to:  Patient Instructions  Please stop the regular metformin and start Metformin ER 2000 mg with dinner.  Please add Januvia 100 mg before the first meal of the day.  Please let me know if the sugars are consistently <80 or >200.  Please return in 1.5 months with your sugar log.   - Strongly advised him to start checking sugars at different times of the day - check once a day, rotating checks - given sugar log and advised how to fill it and to bring it at next appt  - given foot care handout and explained the principles  - given instructions for hypoglycemia management "15-15 rule"  - advised for yearly eye exams  - HBA1c today 7.2% (better) - Return to clinic in 1.5 mo with sugar log   Nicholas Pavlovristina Barba Solt, MD PhD [facility]

## 2015-11-10 ENCOUNTER — Other Ambulatory Visit: Payer: Self-pay | Admitting: Family Medicine

## 2015-11-12 ENCOUNTER — Telehealth: Payer: Self-pay | Admitting: Internal Medicine

## 2015-11-12 ENCOUNTER — Other Ambulatory Visit: Payer: Self-pay

## 2015-11-12 MED ORDER — SITAGLIPTIN PHOSPHATE 100 MG PO TABS: 100.0000 mg | ORAL_TABLET | Freq: Every day | ORAL | 0 refills | Status: DC

## 2015-11-12 NOTE — Telephone Encounter (Signed)
Pt needs rx for [place] [facility] day [facility]

## 2015-12-13 ENCOUNTER — Ambulatory Visit (INDEPENDENT_AMBULATORY_CARE_PROVIDER_SITE_OTHER): Payer: BLUE CROSS/BLUE SHIELD | Admitting: Internal Medicine

## 2015-12-13 ENCOUNTER — Encounter: Payer: Self-pay | Admitting: Internal Medicine

## 2015-12-13 VITALS — BP 142/92 | HR 93 | Ht 71.0 in | Wt 261.0 lb

## 2015-12-13 DIAGNOSIS — E1165 Type 2 diabetes mellitus with hyperglycemia: Secondary | ICD-10-CM

## 2015-12-13 DIAGNOSIS — E1142 Type 2 diabetes mellitus with diabetic polyneuropathy: Secondary | ICD-10-CM | POA: Diagnosis not present

## 2015-12-13 MED ORDER — SITAGLIPTIN PHOSPHATE 100 MG PO TABS: 100.0000 mg | ORAL_TABLET | Freq: Every day | ORAL | 1 refills | Status: DC

## 2015-12-13 MED ORDER — METFORMIN HCL ER 500 MG PO TB24: ORAL_TABLET | ORAL | 3 refills | Status: DC

## 2015-12-13 NOTE — Progress Notes (Signed)
Patient ID: Nicholas [name], male   DOB: 11-17-1970, 45 y.o.   MRN: [id number]   HPI: Nicholas [name] is a 45 y.o.-year-old male, returning for follow-up for DM2, dx in ~2012, non-insulin-dependent, uncontrolled, with complications (PN). Last visit 2 months ago.  Last hemoglobin A1c was: Lab Results  Component Value Date   HGBA1C 7.2 10/14/2015   HGBA1C 7.9 (H) 07/08/2015   HGBA1C 7.8 (H) 12/24/2014  He was on a Prednisone taper for 7 days for neck pain >> 01/2015.  Pt is on a regimen of: - Metformin ER 1000 mg 2x a day, with meals  - Januvia 100 mg in am Had diarrhea, some nausea, with reg. Metformin He was on Glipizide before >> had to stop b/c hypoglycemia.  Pt checks his sugars 0-1 a day and they are: - am: 175-200, 213 >> 131, 140-158 - 2h after b'fast: n/c - before lunch: 170s >> 121-144 - 2h after lunch: n/c >> 121 - before dinner: n/c >> 117-125, 200 - 2h after dinner: n/c >> 147-134, 188 - bedtime: n/c - nighttime: n/c No lows. Lowest sugar was 112; he has hypoglycemia awareness at 70.  Highest sugar was 200 x1.  Glucometer: Verio IQ  Pt's meals are: - Breakfast: skips usually - Lunch: sandwich/salad - Dinner: varies - Snacks: rarely, 1-2  - no CKD, last BUN/creatinine:  Lab Results  Component Value Date   BUN 14 07/08/2015   BUN 14 12/24/2014   CREATININE 0.89 07/08/2015   CREATININE 0.92 12/24/2014  On Lisinopril - last set of lipids: Lab Results  Component Value Date   CHOL 177 12/24/2014   HDL 37.50 (L) 12/24/2014   LDLDIRECT 108.0 12/24/2014   TRIG 273.0 (H) 12/24/2014   CHOLHDL 5 12/24/2014  On Simvastatin. - last eye exam was in 2014. No DR.  - + numbness (no tingling) in his feet.  ROS: Constitutional: no weight gain/loss, no fatigue, no subjective hyperthermia/hypothermia Eyes: no blurry vision, no xerophthalmia ENT: no sore throat, no nodules palpated in throat, no dysphagia/odynophagia, no hoarseness, + tinnitus, +  congestion Cardiovascular: no CP/SOB/palpitations/leg swelling Respiratory: no cough/SOB Gastrointestinal: no N/V/D/C Musculoskeletal: + muscle/+ joint aches Skin: no rashes Neurological: no tremors/+ numbness/no tingling/dizziness  I reviewed pt's medications, allergies, PMH, social hx, family hx, and changes were documented in the history of present illness. Otherwise, unchanged from my initial visit note.  Patient Active Problem List   Diagnosis Date Noted  . Poorly controlled type 2 diabetes mellitus with peripheral neuropathy (HCC) 06/05/2010    Priority: High  . Left shoulder pain 12/19/2014  . Hyperlipidemia 03/06/2008  . PREMATURE VENTRICULAR CONTRACTIONS 11/07/2007   Past surgical history: - Appendectomy in 1982   Social History   Social History  . Marital status: Married    Spouse name: N/A  . Number of children: 1- Stepson    Occupational History  . Scientist    Social History Main Topics  . Smoking status: Former Smoker    Quit date: 03/10/2007  . Smokeless tobacco: Never Used  . Alcohol use No, 1 or 2 drinks per year   . Drug use: No   Current Outpatient Prescriptions on File Prior to Visit  Medication Sig Dispense Refill  . glucose blood (ONETOUCH VERIO) test strip Use once daily for glucose control, dx 250.00 100 each 3  . Lancets (ONETOUCH ULTRASOFT) lancets Use once daily for glucose control. Dx 250.00 100 each 12  . lisinopril (PRINIVIL,ZESTRIL) 5 MG tablet TAKE 1 TABLET DAILY 90  tablet 1  . metFORMIN (GLUCOPHAGE-XR) 500 MG 24 hr tablet Take 2000 mg with dinner 360 tablet 3  . simvastatin (ZOCOR) 10 MG tablet TAKE 1 TABLET AT BEDTIME 90 tablet 1  . sitaGLIPtin (JANUVIA) 100 MG tablet Take 1 tablet (100 mg total) by mouth daily. 90 tablet 0  . traMADol (ULTRAM) 50 MG tablet 1/2-1 tablet twice daily as needed for severe shoulder pain (Patient not taking: Reported on 12/13/2015) 30 tablet 1   No current facility-administered medications on file prior to  visit.    Allergies  Allergen Reactions  . Penicillins    Family history: - See history of present illness, also: - HTN and thyroid disease in grandfather  PE: BP (!) 142/92 (BP Location: Left Arm, Patient Position: Sitting)   Pulse 93   Ht 5\' 11"  (1.803 m)   Wt 261 lb (118.4 kg)   SpO2 94%   BMI 36.40 kg/m  Wt Readings from Last 3 Encounters:  12/13/15 261 lb (118.4 kg)  10/14/15 264 lb (119.7 kg)  12/31/14 261 lb (118.4 kg)   Constitutional: overweight, in NAD Eyes: PERRLA, EOMI, no exophthalmos ENT: moist mucous membranes, no thyromegaly, no cervical lymphadenopathy Cardiovascular: RRR, No MRG Respiratory: CTA B Gastrointestinal: abdomen soft, NT, ND, BS+ Musculoskeletal: no deformities, strength intact in all 4 Skin: moist, warm, no rashes Neurological: no tremor with outstretched hands, DTR normal in all 4  ASSESSMENT: 1. DM2, non-insulin-dependent, uncontrolled, with  Complications - PN  PLAN:  1. Patient with long-standing, uncontrolled diabetes, on oral antidiabetic regimen with metformin + added Januvia at last visit.  - His sugars are better except spikes from using decongestants - last HBA1c 7.2% (better) - I suggested to:  Patient Instructions  Please continue: - Metformin ER 2000 mg with dinner. - Januvia 100 mg before the first meal of the day.  Please let me know if the sugars are consistently <80 or >200.  Please return in 3 months with your sugar log.   - continuechecking sugars at different times of the day - check once a day, rotating checks - advised for yearly eye exams  - will get flu shot at work after congestion resolved - Return to clinic in 3 mo with sugar log   Carlus Pavlovristina Lazette Estala, MD PhD [facility]

## 2015-12-13 NOTE — Patient Instructions (Signed)
Please continue: - Metformin ER 2000 mg with dinner. - Januvia 100 mg before the first meal of the day.  Please let me know if the sugars are consistently <80 or >200.  Please return in 3 months with your sugar log.

## 2016-02-17 ENCOUNTER — Ambulatory Visit: Payer: BLUE CROSS/BLUE SHIELD | Admitting: Internal Medicine

## 2016-04-08 ENCOUNTER — Ambulatory Visit: Payer: BLUE CROSS/BLUE SHIELD | Admitting: Internal Medicine

## 2016-05-10 ENCOUNTER — Other Ambulatory Visit: Payer: Self-pay | Admitting: Family Medicine

## 2016-05-25 ENCOUNTER — Ambulatory Visit: Payer: BLUE CROSS/BLUE SHIELD | Admitting: Internal Medicine

## 2016-07-10 DIAGNOSIS — J019 Acute sinusitis, unspecified: Secondary | ICD-10-CM | POA: Diagnosis not present

## 2016-07-24 ENCOUNTER — Ambulatory Visit (INDEPENDENT_AMBULATORY_CARE_PROVIDER_SITE_OTHER): Payer: BLUE CROSS/BLUE SHIELD | Admitting: Internal Medicine

## 2016-07-24 ENCOUNTER — Encounter: Payer: Self-pay | Admitting: Internal Medicine

## 2016-07-24 VITALS — BP 132/84 | HR 87 | Temp 97.8°F | Ht 71.0 in | Wt 236.0 lb

## 2016-07-24 DIAGNOSIS — E1165 Type 2 diabetes mellitus with hyperglycemia: Secondary | ICD-10-CM

## 2016-07-24 DIAGNOSIS — E1142 Type 2 diabetes mellitus with diabetic polyneuropathy: Secondary | ICD-10-CM | POA: Diagnosis not present

## 2016-07-24 LAB — POCT GLYCOSYLATED HEMOGLOBIN (HGB A1C): Hemoglobin A1C: 6.5

## 2016-07-24 MED ORDER — SITAGLIPTIN PHOSPHATE 100 MG PO TABS: 100.0000 mg | ORAL_TABLET | Freq: Every day | ORAL | 3 refills | Status: DC

## 2016-07-24 MED ORDER — GLUCOSE BLOOD VI STRP: ORAL_STRIP | 3 refills | Status: DC

## 2016-07-24 NOTE — Progress Notes (Signed)
Patient ID: Nicholas [name], male   DOB: October 19, 1970, 46 y.o.   MRN: [id number]   HPI: Nicholas [name] is a 46 y.o.-year-old male, returning for follow-up for DM2, dx in ~2012, non-insulin-dependent, uncontrolled, with complications (PN). Last visit 7 mo ago.  Last hemoglobin A1c was: Lab Results  Component Value Date   HGBA1C 7.2 10/14/2015   HGBA1C 7.9 (H) 07/08/2015   HGBA1C 7.8 (H) 12/24/2014  He was on a Prednisone taper for 7 days for neck pain >> 01/2015.  Pt is on a regimen of: - Metformin ER 1000 mg 2x a day, with meals  - Januvia 100 mg in am Had diarrhea, some nausea, with reg. Metformin He was on Glipizide before >> had to stop b/c hypoglycemia.  Pt checks his sugars 1x a day: - am: 175-200, 213 >> 131, 140-158 >> 103-155 (ave 138) - 2h after b'fast: n/c - before lunch: 170s >> 121-144 >> 120-140 - 2h after lunch: n/c >> 121 >> n/c - before dinner: n/c >> 117-125, 200 >> 121-141 - 2h after dinner: n/c >> 147-134, 188 >> 121-175, 220 - bedtime: n/c - nighttime: n/c No lows. Lowest sugar was 112 >> 103; he has hypoglycemia awareness at 70.  Highest sugar was 200 x1 >> 220.  Glucometer: Verio IQ  Pt's meals are: - Breakfast: skips usually - Lunch: sandwich/salad - Dinner: varies - Snacks: rarely, 1-2  - no CKD, last BUN/creatinine:  Lab Results  Component Value Date   BUN 14 07/08/2015   BUN 14 12/24/2014   CREATININE 0.89 07/08/2015   CREATININE 0.92 12/24/2014  On Lisinopril. - last set of lipids: Lab Results  Component Value Date   CHOL 177 12/24/2014   HDL 37.50 (L) 12/24/2014   LDLDIRECT 108.0 12/24/2014   TRIG 273.0 (H) 12/24/2014   CHOLHDL 5 12/24/2014  On SImvastatin.. - last eye exam was in 2014. No DR.  - he does have numbness in his feet. - on ASA 81.  ROS: Constitutional: no weight gain/no weight loss (+ weight loss per our scale), no fatigue, no subjective hyperthermia, no subjective hypothermia Eyes: no blurry vision, no  xerophthalmia ENT: no sore throat, no nodules palpated in throat, no dysphagia, no odynophagia, no hoarseness Cardiovascular: no CP/no SOB/no palpitations/no leg swelling Respiratory: no cough/no SOB/no wheezing Gastrointestinal: no N/no V/no D/no C/no acid reflux Musculoskeletal: no muscle aches/no joint aches Skin: no rashes, no hair loss Neurological: no tremors/no numbness/no tingling/no dizziness  I reviewed pt's medications, allergies, PMH, social hx, family hx, and changes were documented in the history of present illness. Otherwise, unchanged from my initial visit note.  Patient Active Problem List   Diagnosis Date Noted  . Poorly controlled type 2 diabetes mellitus with peripheral neuropathy (HCC) 06/05/2010    Priority: High  . Left shoulder pain 12/19/2014  . Hyperlipidemia 03/06/2008  . PREMATURE VENTRICULAR CONTRACTIONS 11/07/2007   Past surgical history: - Appendectomy in 1982   Social History   Social History  . Marital status: Married    Spouse name: N/A  . Number of children: 1- Stepson    Occupational History  . Scientist    Social History Main Topics  . Smoking status: Former Smoker    Quit date: 03/10/2007  . Smokeless tobacco: Never Used  . Alcohol use No, 1 or 2 drinks per year   . Drug use: No   Current Outpatient Prescriptions on File Prior to Visit  Medication Sig Dispense Refill  . glucose blood (ONETOUCH VERIO)  test strip Use once daily for glucose control, dx 250.00 100 each 3  . Lancets (ONETOUCH ULTRASOFT) lancets Use once daily for glucose control. Dx 250.00 100 each 12  . lisinopril (PRINIVIL,ZESTRIL) 5 MG tablet TAKE AS INSTRUCTED BY YOUR PRESCRIBER 90 tablet 1  . metFORMIN (GLUCOPHAGE-XR) 500 MG 24 hr tablet Take 2000 mg with dinner 360 tablet 3  . simvastatin (ZOCOR) 10 MG tablet TAKE 1 TABLET AT BEDTIME 90 tablet 1  . sitaGLIPtin (JANUVIA) 100 MG tablet Take 1 tablet (100 mg total) by mouth daily. 90 tablet 1  . traMADol (ULTRAM) 50 MG  tablet 1/2-1 tablet twice daily as needed for severe shoulder pain (Patient not taking: Reported on 12/13/2015) 30 tablet 1   No current facility-administered medications on file prior to visit.    Allergies  Allergen Reactions  . Penicillins    Family history: - See history of present illness, also: - HTN and thyroid disease in grandfather  PE: BP 132/84 (BP Location: Right Arm, Patient Position: Sitting, Cuff Size: Normal)   Pulse 87   Temp 97.8 F (36.6 C) (Oral)   Ht 5\' 11"  (1.803 m)   Wt 236 lb (107 kg)   SpO2 97%   BMI 32.92 kg/m  Wt Readings from Last 3 Encounters:  07/24/16 236 lb (107 kg)  12/13/15 261 lb (118.4 kg)  10/14/15 264 lb (119.7 kg)   Constitutional: overweight, in NAD Eyes: PERRLA, EOMI, no exophthalmos ENT: moist mucous membranes, no thyromegaly, no cervical lymphadenopathy Cardiovascular: RRR, No MRG Respiratory: CTA B Gastrointestinal: abdomen soft, NT, ND, BS+ Musculoskeletal: no deformities, strength intact in all 4 Skin: moist, warm, no rashes Neurological: no tremor with outstretched hands, DTR normal in all 4  ASSESSMENT: 1. DM2, non-insulin-dependent, uncontrolled, with  Complications - PN  PLAN:  1. Patient with long-standing, uncontrolled diabetes, on oral antidiabetic regimen with metformin + januvia, with better control now.  - last HBA1c 7.2% (better) >> today: 6.5% (better). He also lost weight. No changes needed for now.  - I strongly advised him to start eating b'fast - I suggested to:  Patient Instructions  Please continue: - Metformin ER 2000 mg with dinner. - Januvia 100 mg before the first meal of the day.  Please call and schedule an eye appt with Dr. Randon GoldsmithLyles: [facility]:  Dr. Jeni SallesGraham W. Lyles MD ?  Address: [address], [place], [place] [address]  [phone]  Please come back for labs fasting.  Please return in 3 months with your sugar log.   - continue checking sugars at different  times of the day - check 1x a day, rotating checks - advised for yearly eye exams >> he needs one >> advised to schedule - will return for a fasting CMP and Lipids - Return to clinic in 3 mo with sugar log   Carlus Pavlovristina Raistlin Gum, MD PhD [facility]

## 2016-07-24 NOTE — Patient Instructions (Addendum)
Please continue: - Metformin ER 2000 mg with dinner. - Januvia 100 mg before the first meal of the day.  Please call and schedule an eye appt with Dr. Randon GoldsmithLyles: [facility]:  Dr. Jeni SallesGraham W. Lyles MD ?  Address: [address], [place], [place] [address]  [phone]  Please come back for labs fasting.  Please return in 3 months with your sugar log.

## 2016-09-04 ENCOUNTER — Other Ambulatory Visit (INDEPENDENT_AMBULATORY_CARE_PROVIDER_SITE_OTHER): Payer: BLUE CROSS/BLUE SHIELD

## 2016-09-04 DIAGNOSIS — E1142 Type 2 diabetes mellitus with diabetic polyneuropathy: Secondary | ICD-10-CM | POA: Diagnosis not present

## 2016-09-04 DIAGNOSIS — E1165 Type 2 diabetes mellitus with hyperglycemia: Secondary | ICD-10-CM | POA: Diagnosis not present

## 2016-09-04 LAB — COMPLETE METABOLIC PANEL WITH GFR
ALBUMIN: 4.4 g/dL (ref 3.6–5.1)
ALK PHOS: 55 U/L (ref 40–115)
ALT: 49 U/L — ABNORMAL HIGH (ref 9–46)
AST: 40 U/L (ref 10–40)
BUN: 12 mg/dL (ref 7–25)
CO2: 26 mmol/L (ref 20–31)
Calcium: 9.5 mg/dL (ref 8.6–10.3)
Chloride: 100 mmol/L (ref 98–110)
Creat: 0.91 mg/dL (ref 0.60–1.35)
GFR, Est African American: >89 mL/min (ref >=60)
GFR, Est Non African American: >89 mL/min (ref >=60)
GLUCOSE: 121 mg/dL — AB (ref 65–99)
POTASSIUM: 5.1 mmol/L (ref 3.5–5.3)
SODIUM: 136 mmol/L (ref 135–146)
Total Bilirubin: 1.2 mg/dL (ref 0.2–1.2)
Total Protein: 7 g/dL (ref 6.1–8.1)

## 2016-09-04 LAB — LIPID PANEL
CHOL/HDL RATIO: 4
Cholesterol: 153 mg/dL (ref 0–200)
HDL: 37.4 mg/dL — ABNORMAL LOW (ref >39.00)
NonHDL: 115.69
Triglycerides: 261 mg/dL — ABNORMAL HIGH (ref 0.0–149.0)
VLDL: 52.2 mg/dL — AB (ref 0.0–40.0)

## 2016-09-04 LAB — LDL CHOLESTEROL, DIRECT: Direct LDL: 83 mg/dL

## 2016-09-08 ENCOUNTER — Telehealth: Payer: Self-pay

## 2016-09-08 NOTE — Telephone Encounter (Signed)
LVM, gave lab results. Gave call back number if any questions or concerns.  

## 2016-09-18 DIAGNOSIS — L03115 Cellulitis of right lower limb: Secondary | ICD-10-CM | POA: Diagnosis not present

## 2016-09-18 DIAGNOSIS — S8981XA Other specified injuries of right lower leg, initial encounter: Secondary | ICD-10-CM | POA: Diagnosis not present

## 2016-09-21 ENCOUNTER — Emergency Department (HOSPITAL_COMMUNITY)
Admission: EM | Admit: 2016-09-21 | Discharge: 2016-09-21 | Disposition: A | Discharge status: Home / Self Care | Payer: BLUE CROSS/BLUE SHIELD | Attending: Emergency Medicine | Admitting: Emergency Medicine

## 2016-09-21 DIAGNOSIS — Z794 Long term (current) use of insulin: Secondary | ICD-10-CM | POA: Insufficient documentation

## 2016-09-21 DIAGNOSIS — Z87891 Personal history of nicotine dependence: Secondary | ICD-10-CM | POA: Diagnosis not present

## 2016-09-21 DIAGNOSIS — E119 Type 2 diabetes mellitus without complications: Secondary | ICD-10-CM | POA: Insufficient documentation

## 2016-09-21 DIAGNOSIS — L03115 Cellulitis of right lower limb: Secondary | ICD-10-CM | POA: Diagnosis not present

## 2016-09-21 DIAGNOSIS — Z79899 Other long term (current) drug therapy: Secondary | ICD-10-CM | POA: Insufficient documentation

## 2016-09-21 LAB — CBC WITH DIFFERENTIAL/PLATELET
BASOS ABS: 0.1 10*3/uL (ref 0.0–0.1)
BASOS PCT: 1 %
Eosinophils Absolute: 0.3 10*3/uL (ref 0.0–0.7)
Eosinophils Relative: 3 %
HCT: 43.8 % (ref 39.0–52.0)
Hemoglobin: 15.6 g/dL (ref 13.0–17.0)
LYMPHS PCT: 24 %
Lymphs Abs: 2.5 10*3/uL (ref 0.7–4.0)
MCH: 31.2 pg (ref 26.0–34.0)
MCHC: 35.6 g/dL (ref 30.0–36.0)
MCV: 87.6 fL (ref 78.0–100.0)
MONO ABS: 0.6 10*3/uL (ref 0.1–1.0)
Monocytes Relative: 6 %
NEUTROS ABS: 6.9 10*3/uL (ref 1.7–7.7)
NEUTROS PCT: 66 %
Platelets: 260 10*3/uL (ref 150–400)
RBC: 5 MIL/uL (ref 4.22–5.81)
RDW: 12.3 % (ref 11.5–15.5)
WBC: 10.3 10*3/uL (ref 4.0–10.5)

## 2016-09-21 LAB — COMPREHENSIVE METABOLIC PANEL
ALBUMIN: 4.6 g/dL (ref 3.5–5.0)
ALT: 47 U/L (ref 17–63)
AST: 47 U/L — AB (ref 15–41)
Alkaline Phosphatase: 84 U/L (ref 38–126)
Anion gap: 11 (ref 5–15)
BILIRUBIN TOTAL: 1.4 mg/dL — AB (ref 0.3–1.2)
BUN: 16 mg/dL (ref 6–20)
CHLORIDE: 102 mmol/L (ref 101–111)
CO2: 25 mmol/L (ref 22–32)
CREATININE: 0.9 mg/dL (ref 0.61–1.24)
Calcium: 9.5 mg/dL (ref 8.9–10.3)
GFR calc Af Amer: >60 mL/min (ref >60)
GFR calc non Af Amer: >60 mL/min (ref >60)
GLUCOSE: 136 mg/dL — AB (ref 65–99)
POTASSIUM: 4.2 mmol/L (ref 3.5–5.1)
Sodium: 138 mmol/L (ref 135–145)
Total Protein: 8.1 g/dL (ref 6.5–8.1)

## 2016-09-21 LAB — CK: Total CK: 293 U/L (ref 49–397)

## 2016-09-21 LAB — I-STAT CG4 LACTIC ACID, ED: Lactic Acid, Venous: 1.84 mmol/L (ref 0.5–1.9)

## 2016-09-21 MED ORDER — CLINDAMYCIN PHOSPHATE 600 MG/50ML IV SOLN
600.0000 mg | Freq: Once | INTRAVENOUS | Status: AC
  Administered 2016-09-21: 600 mg via INTRAVENOUS
  Filled 2016-09-21: qty 50

## 2016-09-21 MED ORDER — CLINDAMYCIN HCL 300 MG PO CAPS: 300.0000 mg | ORAL_CAPSULE | Freq: Four times a day (QID) | ORAL | 0 refills | Status: DC

## 2016-09-21 NOTE — Discharge Instructions (Signed)
Take clindamycin four times daily for a week. Stop doxycyline.   See your doctor in a week   Return to ER if you have fever, worsening redness and swelling.

## 2016-09-21 NOTE — ED Notes (Signed)
Discharge instructions reviewed with patient. Patient verbalizes understanding. VSS.   
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
## 2016-11-06 ENCOUNTER — Ambulatory Visit: Payer: BLUE CROSS/BLUE SHIELD | Admitting: Internal Medicine

## 2016-11-07 ENCOUNTER — Other Ambulatory Visit: Payer: Self-pay | Admitting: Family Medicine

## 2016-11-27 ENCOUNTER — Encounter: Payer: Self-pay | Admitting: Family Medicine

## 2016-12-07 ENCOUNTER — Other Ambulatory Visit: Payer: Self-pay | Admitting: Internal Medicine

## 2017-03-23 ENCOUNTER — Encounter: Payer: Self-pay | Admitting: Family Medicine

## 2017-03-23 ENCOUNTER — Ambulatory Visit (INDEPENDENT_AMBULATORY_CARE_PROVIDER_SITE_OTHER): Payer: BLUE CROSS/BLUE SHIELD | Admitting: Family Medicine

## 2017-03-23 ENCOUNTER — Encounter: Payer: Self-pay | Admitting: *Deleted

## 2017-03-23 VITALS — BP 136/80 | HR 90 | Temp 98.0°F | Resp 12 | Ht 71.0 in | Wt 269.5 lb

## 2017-03-23 DIAGNOSIS — M109 Gout, unspecified: Secondary | ICD-10-CM | POA: Diagnosis not present

## 2017-03-23 LAB — URIC ACID: URIC ACID, SERUM: 7.7 mg/dL (ref 4.0–7.8)

## 2017-03-23 MED ORDER — PREDNISONE 20 MG PO TABS: 40.0000 mg | ORAL_TABLET | Freq: Every day | ORAL | 0 refills | Status: AC

## 2017-03-23 MED ORDER — COLCHICINE 0.6 MG PO CAPS: ORAL_CAPSULE | ORAL | 0 refills | Status: DC

## 2017-03-23 NOTE — Patient Instructions (Signed)
Nicholas [name] have seen you today for an acute visit.  A few things to remember from today's visit:   Podagra - Plan: predniSONE (DELTASONE) 20 MG tablet, Uric acid, Colchicine (MITIGARE) 0.6 MG CAPS  Acute gouty arthritis - Plan: predniSONE (DELTASONE) 20 MG tablet, Uric acid, Colchicine (MITIGARE) 0.6 MG CAPS   Medications prescribed today are intended for short period of time and will not be refill upon request, a follow up appointment might be necessary to discuss continuation of of treatment if appropriate.  Start Prednisone and Colchicine.  Gout Gout is painful swelling that can happen in some of your joints. Gout is a type of arthritis. This condition is caused by having too much uric acid in your body. Uric acid is a chemical that is made when your body breaks down substances called purines. If your body has too much uric acid, sharp crystals can form and build up in your joints. This causes pain and swelling. Gout attacks can happen quickly and be very painful (acute gout). Over time, the attacks can affect more joints and happen more often (chronic gout). Follow these instructions at home: During a Gout Attack  If directed, put ice on the painful area: ? Put ice in a plastic bag. ? Place a towel between your skin and the bag. ? Leave the ice on for 20 minutes, 2-3 times a day.  Rest the joint as much as possible. If the joint is in your leg, you may be given crutches to use.  Raise (elevate) the painful joint above the level of your heart as often as you can.  Drink enough fluids to keep your pee (urine) clear or pale yellow.  Take over-the-counter and prescription medicines only as told by your doctor.  Do not drive or use heavy machinery while taking prescription pain medicine.  Follow instructions from your doctor about what you can or cannot eat and drink.  Return to your normal activities as told by your doctor. Ask your doctor what activities are safe  for you. Avoiding Future Gout Attacks  Follow a low-purine diet as told by a specialist (dietitian) or your doctor. Avoid foods and drinks that have a lot of purines, such as: ? Liver. ? Kidney. ? Anchovies. ? Asparagus. ? Herring. ? Mushrooms ? Mussels. ? Beer.  Limit alcohol intake to no more than 1 drink a day for nonpregnant women and 2 drinks a day for men. One drink equals 12 oz of beer, 5 oz of wine, or 1 oz of hard liquor.  Stay at a healthy weight or lose weight if you are overweight. If you want to lose weight, talk with your doctor. It is important that you do not lose weight too fast.  Start or continue an exercise plan as told by your doctor.  Drink enough fluids to keep your pee clear or pale yellow.  Take over-the-counter and prescription medicines only as told by your doctor.  Keep all follow-up visits as told by your doctor. This is important. Contact a doctor if:  You have another gout attack.  You still have symptoms of a gout attack after10 days of treatment.  You have problems (side effects) because of your medicines.  You have chills or a fever.  You have burning pain when you pee (urinate).  You have pain in your lower back or belly. Get help right away if:  You have very bad pain.  Your pain cannot be controlled.  You cannot pee.  This information is not intended to replace advice given to you by your health care provider. Make sure you discuss any questions you have with your health care provider. Document Released: 12/03/2007 Document Revised: 08/01/2015 Document Reviewed: 12/06/2014 Elsevier Interactive Patient Education  [employer].    In general please monitor for signs of worsening symptoms and seek immediate medical attention if any concerning.  If symptoms are not resolved in a few days/weeks you should schedule a follow up appointment with your doctor, before if needed.  [name] hope you get better soon!

## 2017-03-23 NOTE — Progress Notes (Signed)
ACUTE VISIT   HPI:  Chief Complaint  Patient presents with  . Left foot pain    started Friday, swelling has gone down some    [name] is a 47 y.o. male, who is here today complaining of 4 days of sudden onset of left foot edema and erythema, MTP joint of great toe. He remembers having similar problem about 4 years ago, resolved after treatment with Indomethacin.  According to patient, he had uric acid and foot imaging, both were not conclusive for gout and diagnosed with degenerative changes of left foot.  He denies any injury or unusual activity. Today he started with problem pain was exacerbated by prolonged walking. He has been taking OTC Ibuprofen 800 mg 3 times per day, last dose this morning. Problem has improved. Pain is "not bad" now, reporting 50% improvement of symptoms.  Pain is exacerbated by light palpation, joint movement, shoe wear, and walking. Pain is alleviated by elevation and rest.  She denies associated fever, chills, chest pain, dyspnea, palpitations, numbness, or tingling.  He usually does not drink alcohol but during the holidays he drank a few beers.   History of DM 2, BS's between 120 and 160.  Lab Results  Component Value Date   HGBA1C 6.5 07/24/2016    Review of Systems  Constitutional: Negative for chills, fatigue and fever.  Respiratory: Negative for chest tightness, shortness of breath and wheezing.   Cardiovascular: Negative for palpitations and leg swelling.  Gastrointestinal: Negative for abdominal pain, nausea and vomiting.  Endocrine: Negative for polydipsia, polyphagia and polyuria.  Genitourinary: Negative for decreased urine volume and hematuria.  Musculoskeletal: Positive for arthralgias, gait problem and joint swelling. Negative for back pain.  Skin: Negative for rash and wound.  Neurological: Negative for weakness and numbness.      Current Outpatient Medications on File Prior to Visit  Medication Sig  Dispense Refill  . glucose blood (ONETOUCH VERIO) test strip Use once daily for glucose control, dx 250.00 100 each 3  . KRILL OIL PO Take 1 capsule by mouth daily.    . Lancets (ONETOUCH ULTRASOFT) lancets Use once daily for glucose control. Dx 250.00 100 each 12  . lisinopril (PRINIVIL,ZESTRIL) 5 MG tablet TAKE AS INSTRUCTED BY YOUR PRESCRIBER 90 tablet 4  . MAGNESIUM PO Take 1 capsule by mouth 2 (two) times daily.    . metFORMIN (GLUCOPHAGE-XR) 500 MG 24 hr tablet TAKE 4 TABLETS (2000 MG) WITH DINNER 360 tablet 3  . Multiple Vitamins-Minerals (MULTIVITAMIN ADULTS) TABS Take 1 tablet by mouth at bedtime.    . Potassium 99 MG TABS Take 1 tablet by mouth daily.    . simvastatin (ZOCOR) 10 MG tablet TAKE 1 TABLET AT BEDTIME 90 tablet 4  . sitaGLIPtin (JANUVIA) 100 MG tablet Take 1 tablet (100 mg total) by mouth daily. 90 tablet 3  . clindamycin (CLEOCIN) 300 MG capsule Take 1 capsule (300 mg total) by mouth 4 (four) times daily. X 7 days (Patient not taking: Reported on 03/23/2017) 28 capsule 0  . doxycycline (VIBRA-TABS) 100 MG tablet Take 100 mg by mouth 2 (two) times daily.  0  . traMADol (ULTRAM) 50 MG tablet 1/2-1 tablet twice daily as needed for severe shoulder pain (Patient not taking: Reported on 12/13/2015) 30 tablet 1   No current facility-administered medications on file prior to visit.      Past Medical History:  Diagnosis Date  . Diabetes mellitus    Allergies  Allergen  Reactions  . Penicillins     Social History   Socioeconomic History  . Marital status: Married    Spouse name: None  . Number of children: None  . Years of education: None  . Highest education level: None  Social Needs  . Financial resource strain: None  . Food insecurity - worry: None  . Food insecurity - inability: None  . Transportation needs - medical: None  . Transportation needs - non-medical: None  Occupational History  . None  Tobacco Use  . Smoking status: Former Smoker    Last attempt  to quit: 03/10/2007    Years since quitting: 10.0  . Smokeless tobacco: Never Used  Substance and Sexual Activity  . Alcohol use: No  . Drug use: No  . Sexual activity: None  Other Topics Concern  . None  Social History Narrative  . None    Vitals:   03/23/17 1024  BP: 136/80  Pulse: 90  Resp: 12  Temp: 98 F (36.7 C)  SpO2: 96%   Body mass index is 37.59 kg/m.   Physical Exam  Nursing note and vitals reviewed. Constitutional: He is oriented to person, place, and time. He appears well-developed. No distress.  HENT:  Head: Normocephalic and atraumatic.  Mouth/Throat: Oropharynx is clear and moist and mucous membranes are normal.  Eyes: Conjunctivae are normal.  Cardiovascular: Normal rate and regular rhythm.  Pulses:      Dorsalis pedis pulses are 2+ on the left side.       Posterior tibial pulses are 2+ on the left side.  Respiratory: Effort normal. No respiratory distress.  Musculoskeletal: He exhibits edema and tenderness.       Feet:  Mild limitation of MTP joint of great toe left foot due to pain. Local heat and mild erythema on lateral aspect of joint mainly and mild erythema extending to dorsal aspect of metatarsus with no tenderness in this area.  No induration or wound appreciated.  Neurological: He is alert and oriented to person, place, and time. He has normal strength.  Antalgic gait.  Skin: Skin is warm. No rash noted. There is erythema.  Psychiatric: He has a normal mood and affect.  Well groomed, good eye contact.     ASSESSMENT AND PLAN:   Kaelum was seen today for left foot pain.  Diagnoses and all orders for this visit:  Podagra -     predniSONE (DELTASONE) 20 MG tablet; Take 2 tablets (40 mg total) by mouth daily with breakfast for 5 days. -     Uric acid -     Colchicine (MITIGARE) 0.6 MG CAPS; 1 cap bid for 5-7 days. In the future 2 caps once with acute onset.  Acute gouty arthritis -     predniSONE (DELTASONE) 20 MG tablet; Take 2  tablets (40 mg total) by mouth daily with breakfast for 5 days. -     Uric acid -     Colchicine (MITIGARE) 0.6 MG CAPS; 1 cap bid for 5-7 days. In the future 2 caps once with acute onset.   History and clinical findings suggest acute gout episode. Educated about diagnosis, prognosis, and treatment options. Low purine diet strongly recommended. Prednisone side effects discussed, instructed to monitor BS closely. Colchicine 0.6 mg twice daily for 5-7 days.   Recommend Colchicine 0.6 mg 2 capsules once upon acute onset for future exacerbation. He was clearly instructed about warning signs, he will monitor for worsening symptoms and fever, trauma. Follow-up with PCP  as needed.  -Mr.[name] was advised to seek immediate medical attention if sudden worsening symptoms or to follow if they persist or if new concerns arise.       Steele Stracener G. [place], MD  [facility]. [place] office.

## 2017-06-08 ENCOUNTER — Telehealth: Payer: Self-pay | Admitting: Internal Medicine

## 2017-06-08 NOTE — Telephone Encounter (Signed)
Patient has appointment in June with Dr Elvera LennoxGherghe and would like to know if the Dr wants any labs done prior to that visit.  He states he gets them done but is not sure if he gets them done before the appt or the day of .   Please advise

## 2017-06-09 NOTE — Telephone Encounter (Signed)
Left vm explaining this to pt

## 2017-06-09 NOTE — Telephone Encounter (Signed)
No, I always decide for labs at the time of the visit.

## 2017-06-09 NOTE — Telephone Encounter (Signed)
Please advise if pt needs labs prior to next visit

## 2017-07-02 ENCOUNTER — Emergency Department (HOSPITAL_BASED_OUTPATIENT_CLINIC_OR_DEPARTMENT_OTHER): Payer: BLUE CROSS/BLUE SHIELD

## 2017-07-02 ENCOUNTER — Ambulatory Visit (HOSPITAL_BASED_OUTPATIENT_CLINIC_OR_DEPARTMENT_OTHER)
Admission: EM | Admit: 2017-07-02 | Discharge: 2017-07-04 | Disposition: A | Discharge status: Home / Self Care | Payer: BLUE CROSS/BLUE SHIELD | Attending: Neurosurgery | Admitting: Neurosurgery

## 2017-07-02 ENCOUNTER — Other Ambulatory Visit: Payer: Self-pay

## 2017-07-02 ENCOUNTER — Emergency Department (HOSPITAL_COMMUNITY): Payer: BLUE CROSS/BLUE SHIELD

## 2017-07-02 ENCOUNTER — Encounter (HOSPITAL_BASED_OUTPATIENT_CLINIC_OR_DEPARTMENT_OTHER): Payer: Self-pay | Admitting: *Deleted

## 2017-07-02 DIAGNOSIS — J9811 Atelectasis: Secondary | ICD-10-CM | POA: Insufficient documentation

## 2017-07-02 DIAGNOSIS — E785 Hyperlipidemia, unspecified: Secondary | ICD-10-CM | POA: Diagnosis not present

## 2017-07-02 DIAGNOSIS — Z79899 Other long term (current) drug therapy: Secondary | ICD-10-CM | POA: Diagnosis not present

## 2017-07-02 DIAGNOSIS — Z7984 Long term (current) use of oral hypoglycemic drugs: Secondary | ICD-10-CM | POA: Insufficient documentation

## 2017-07-02 DIAGNOSIS — S134XXA Sprain of ligaments of cervical spine, initial encounter: Secondary | ICD-10-CM | POA: Diagnosis not present

## 2017-07-02 DIAGNOSIS — Z880 Allergy status to penicillin: Secondary | ICD-10-CM | POA: Diagnosis not present

## 2017-07-02 DIAGNOSIS — M4712 Other spondylosis with myelopathy, cervical region: Secondary | ICD-10-CM | POA: Insufficient documentation

## 2017-07-02 DIAGNOSIS — M549 Dorsalgia, unspecified: Secondary | ICD-10-CM

## 2017-07-02 DIAGNOSIS — M532X2 Spinal instabilities, cervical region: Secondary | ICD-10-CM | POA: Diagnosis present

## 2017-07-02 DIAGNOSIS — D72829 Elevated white blood cell count, unspecified: Secondary | ICD-10-CM | POA: Diagnosis present

## 2017-07-02 DIAGNOSIS — M25512 Pain in left shoulder: Secondary | ICD-10-CM | POA: Diagnosis not present

## 2017-07-02 DIAGNOSIS — M50022 Cervical disc disorder at C5-C6 level with myelopathy: Secondary | ICD-10-CM | POA: Diagnosis not present

## 2017-07-02 DIAGNOSIS — W19XXXA Unspecified fall, initial encounter: Secondary | ICD-10-CM

## 2017-07-02 DIAGNOSIS — Z87891 Personal history of nicotine dependence: Secondary | ICD-10-CM | POA: Diagnosis not present

## 2017-07-02 DIAGNOSIS — Z23 Encounter for immunization: Secondary | ICD-10-CM | POA: Insufficient documentation

## 2017-07-02 DIAGNOSIS — M5412 Radiculopathy, cervical region: Secondary | ICD-10-CM | POA: Diagnosis not present

## 2017-07-02 DIAGNOSIS — S0990XA Unspecified injury of head, initial encounter: Secondary | ICD-10-CM | POA: Diagnosis not present

## 2017-07-02 DIAGNOSIS — M4802 Spinal stenosis, cervical region: Secondary | ICD-10-CM | POA: Diagnosis not present

## 2017-07-02 DIAGNOSIS — I493 Ventricular premature depolarization: Secondary | ICD-10-CM | POA: Insufficient documentation

## 2017-07-02 DIAGNOSIS — S129XXA Fracture of neck, unspecified, initial encounter: Secondary | ICD-10-CM | POA: Diagnosis present

## 2017-07-02 DIAGNOSIS — S12690A Other displaced fracture of seventh cervical vertebra, initial encounter for closed fracture: Secondary | ICD-10-CM | POA: Diagnosis not present

## 2017-07-02 DIAGNOSIS — Z419 Encounter for procedure for purposes other than remedying health state, unspecified: Secondary | ICD-10-CM

## 2017-07-02 DIAGNOSIS — S299XXA Unspecified injury of thorax, initial encounter: Secondary | ICD-10-CM | POA: Diagnosis not present

## 2017-07-02 DIAGNOSIS — E1142 Type 2 diabetes mellitus with diabetic polyneuropathy: Secondary | ICD-10-CM | POA: Diagnosis not present

## 2017-07-02 DIAGNOSIS — S199XXA Unspecified injury of neck, initial encounter: Secondary | ICD-10-CM | POA: Diagnosis not present

## 2017-07-02 DIAGNOSIS — J432 Centrilobular emphysema: Secondary | ICD-10-CM | POA: Diagnosis not present

## 2017-07-02 DIAGNOSIS — S12600A Unspecified displaced fracture of seventh cervical vertebra, initial encounter for closed fracture: Secondary | ICD-10-CM | POA: Diagnosis present

## 2017-07-02 DIAGNOSIS — S4992XA Unspecified injury of left shoulder and upper arm, initial encounter: Secondary | ICD-10-CM | POA: Diagnosis not present

## 2017-07-02 DIAGNOSIS — I10 Essential (primary) hypertension: Secondary | ICD-10-CM | POA: Diagnosis present

## 2017-07-02 DIAGNOSIS — M50122 Cervical disc disorder at C5-C6 level with radiculopathy: Secondary | ICD-10-CM | POA: Insufficient documentation

## 2017-07-02 DIAGNOSIS — W109XXA Fall (on) (from) unspecified stairs and steps, initial encounter: Secondary | ICD-10-CM | POA: Insufficient documentation

## 2017-07-02 DIAGNOSIS — W108XXA Fall (on) (from) other stairs and steps, initial encounter: Secondary | ICD-10-CM | POA: Diagnosis present

## 2017-07-02 DIAGNOSIS — S0081XA Abrasion of other part of head, initial encounter: Secondary | ICD-10-CM | POA: Insufficient documentation

## 2017-07-02 DIAGNOSIS — M5410 Radiculopathy, site unspecified: Secondary | ICD-10-CM

## 2017-07-02 DIAGNOSIS — M546 Pain in thoracic spine: Secondary | ICD-10-CM | POA: Diagnosis not present

## 2017-07-02 DIAGNOSIS — J439 Emphysema, unspecified: Secondary | ICD-10-CM | POA: Diagnosis not present

## 2017-07-02 HISTORY — DX: Essential (primary) hypertension: I10

## 2017-07-02 LAB — TROPONIN I: Troponin I: <0.03 ng/mL (ref <0.03)

## 2017-07-02 LAB — BASIC METABOLIC PANEL
Anion gap: 12 (ref 5–15)
BUN: 16 mg/dL (ref 6–20)
CO2: 22 mmol/L (ref 22–32)
CREATININE: 0.84 mg/dL (ref 0.61–1.24)
Calcium: 9.1 mg/dL (ref 8.9–10.3)
Chloride: 102 mmol/L (ref 101–111)
GFR calc Af Amer: >60 mL/min (ref >60)
GLUCOSE: 245 mg/dL — AB (ref 65–99)
POTASSIUM: 4.1 mmol/L (ref 3.5–5.1)
Sodium: 136 mmol/L (ref 135–145)

## 2017-07-02 LAB — CBC WITH DIFFERENTIAL/PLATELET
BASOS ABS: 0 10*3/uL (ref 0.0–0.1)
Basophils Relative: 0 %
EOS PCT: 0 %
Eosinophils Absolute: 0 10*3/uL (ref 0.0–0.7)
HCT: 43.5 % (ref 39.0–52.0)
Hemoglobin: 15.6 g/dL (ref 13.0–17.0)
LYMPHS ABS: 0.8 10*3/uL (ref 0.7–4.0)
LYMPHS PCT: 5 %
MCH: 32 pg (ref 26.0–34.0)
MCHC: 35.9 g/dL (ref 30.0–36.0)
MCV: 89.1 fL (ref 78.0–100.0)
MONO ABS: 0.5 10*3/uL (ref 0.1–1.0)
Monocytes Relative: 3 %
Neutro Abs: 13.8 10*3/uL — ABNORMAL HIGH (ref 1.7–7.7)
Neutrophils Relative %: 92 %
PLATELETS: 238 10*3/uL (ref 150–400)
RBC: 4.88 MIL/uL (ref 4.22–5.81)
RDW: 12.8 % (ref 11.5–15.5)
WBC: 15 10*3/uL — ABNORMAL HIGH (ref 4.0–10.5)

## 2017-07-02 MED ORDER — IOPAMIDOL (ISOVUE-300) INJECTION 61%
100.0000 mL | Freq: Once | INTRAVENOUS | Status: AC | PRN
  Administered 2017-07-02: 80 mL via INTRAVENOUS

## 2017-07-02 MED ORDER — FENTANYL CITRATE (PF) 100 MCG/2ML IJ SOLN
50.0000 ug | Freq: Once | INTRAMUSCULAR | Status: AC
  Administered 2017-07-02: 50 ug via INTRAMUSCULAR
  Filled 2017-07-02: qty 2

## 2017-07-02 MED ORDER — SODIUM CHLORIDE 0.9 % IV BOLUS
500.0000 mL | Freq: Once | INTRAVENOUS | Status: AC
  Administered 2017-07-02: 500 mL via INTRAVENOUS

## 2017-07-02 MED ORDER — HYDROMORPHONE HCL 2 MG/ML IJ SOLN
1.0000 mg | Freq: Once | INTRAMUSCULAR | Status: AC
  Administered 2017-07-02: 1 mg via INTRAVENOUS
  Filled 2017-07-02: qty 1

## 2017-07-02 MED ORDER — MORPHINE SULFATE (PF) 4 MG/ML IV SOLN
4.0000 mg | Freq: Once | INTRAVENOUS | Status: AC
  Administered 2017-07-02: 4 mg via INTRAMUSCULAR
  Filled 2017-07-02: qty 1

## 2017-07-02 MED ORDER — TETANUS-DIPHTH-ACELL PERTUSSIS 5-2.5-18.5 LF-MCG/0.5 IM SUSP
0.5000 mL | Freq: Once | INTRAMUSCULAR | Status: AC
  Administered 2017-07-02: 0.5 mL via INTRAMUSCULAR
  Filled 2017-07-02: qty 0.5

## 2017-07-02 MED ORDER — LORAZEPAM 2 MG/ML IJ SOLN
0.5000 mg | Freq: Once | INTRAMUSCULAR | Status: AC
  Administered 2017-07-02: 0.5 mg via INTRAVENOUS
  Filled 2017-07-02: qty 1

## 2017-07-02 MED ORDER — SODIUM CHLORIDE 0.9 % IV BOLUS
1000.0000 mL | Freq: Once | INTRAVENOUS | Status: AC
  Administered 2017-07-02: 1000 mL via INTRAVENOUS

## 2017-07-02 MED ORDER — MORPHINE SULFATE (PF) 4 MG/ML IV SOLN
4.0000 mg | Freq: Once | INTRAVENOUS | Status: AC
  Administered 2017-07-02: 4 mg via INTRAVENOUS
  Filled 2017-07-02: qty 1

## 2017-07-02 MED ORDER — HYDROMORPHONE HCL 1 MG/ML IJ SOLN
1.0000 mg | Freq: Once | INTRAMUSCULAR | Status: AC
  Administered 2017-07-02: 1 mg via INTRAVENOUS
  Filled 2017-07-02: qty 1

## 2017-07-02 MED ORDER — ONDANSETRON HCL 4 MG/2ML IJ SOLN
4.0000 mg | Freq: Once | INTRAMUSCULAR | Status: AC
  Administered 2017-07-02: 4 mg via INTRAVENOUS
  Filled 2017-07-02: qty 2

## 2017-07-02 NOTE — ED Provider Notes (Addendum)
[facility] of [facility] spine after fall down a flight of stairs today.  Patient had head, neck, chest CT that were unremarkable. He had left shoulder x-ray that showed no acute injuries.  Patient continued to have paresthesias in the left upper extremity and was sent here for MRI to rule out any ligamentous injury or bony injury.  Patient on exam has no abdominal tenderness.  He has normal vitals upon arrival.  Patient continues to have some left shoulder pain.  Patient has some numbness in his left thumb, index finger, middle finger.  Otherwise exam is unremarkable.  He has normal strength in his bilateral upper extremities.  Has midline spinal pain on exam cervical exam.  Will obtain MRI cervical spine to rule out further injury.  Patient given IV Dilaudid for pain. Pt given IV ativan for MRI.  Talked with radiology on the phone and they state that patient has acute traumatic fracture through the left C6-7 facet involving the superior articular process at C7 with posterior displacement of the right C6-7 facet.  There is disruption of multiple ligaments as well and considered an unstable C-spine fracture. Patient and nursing staff were made aware again that patient is to be logrolled only/hard collar at all times. Dr. Lovell SheehanJenkins with neurosurgery was consulted and recommend pain control and hard collar at all times, NPO for possible surgical intervention tomorrow. Given additional dose of IV dilaudid. Pt to be admitted to medicine, talked with trauma about possible admission but due to isolated system can be handled on medicine team.  Fall, initial encounter  Back pain, unspecified back location, unspecified back pain laterality, unspecified chronicity  Radiculopathy, unspecified spinal region  Acute pain of left shoulder  Unstable cervical spine  Injury to ligament of cervical spine, initial encounter   Dispo: Admit   Virgina NorfolkCuratolo, Leesa Leifheit, DO 07/03/17  0143    Virgina Norfolkuratolo, Shalini Mair, DO 07/03/17 0214    Virgina Norfolkuratolo, Loyal Rudy, DO 07/03/17 0217    Tegeler, Canary Brimhristopher J, MD 07/03/17 1051

## 2017-07-02 NOTE — ED Notes (Signed)
CT will need to wait for bun/creat to result prior to imaging with IV contrast as ordered, [facility], pt with HX DM on metformin

## 2017-07-02 NOTE — ED Notes (Signed)
Pt removed C-Collar and refuses to have it replaced. He is requesting something else for pain.

## 2017-07-02 NOTE — ED Notes (Signed)
Pt requesting additional pain medication, MD aware. Pt medicated per MD order then taken to MRI via stretcher

## 2017-07-02 NOTE — ED Notes (Signed)
O2 2L applied; no resp distress, pt skin color and temp WNL.

## 2017-07-02 NOTE — ED Provider Notes (Signed)
[facility] EMERGENCY DEPARTMENT Provider Note   CSN: [id number] Arrival date & time: 07/02/17  1337     History   Chief Complaint Chief Complaint  Patient presents with  . Fall    HPI Nicholas [name] is a 47 y.o. male.  HPI   Patient is a 47 year old male who presents the ED s/p fall down a flight of stairs that have occurred prior to arrival.  Patient states that he turned while he was on the stairs and then fell backwards down stairs.  States he landed on his back he also hit his head.  He did not lose consciousness.  He denies headache, vision changes, lightheadedness or dizziness.  He is endorsing neck pain, left shoulder pain, anterior chest pain and pain with inspiration.  States pain was sudden in onset and is severe and constant in nature.  He has not taken any medications prior to arrival.  Denies any episodes of vomiting since the fall.  Endorses some paresthesias to thumb index finger and middle finger on left hand, but denies numbness.  No numbness or weakness to the remainder of the bilateral upper or lower extremities.  No loss control of bowels or bladder function.  Has been ambulatory since the fall.  He is not anticoagulated.  Past Medical History:  Diagnosis Date  . Diabetes mellitus     Patient Active Problem List   Diagnosis Date Noted  . Left shoulder pain 12/19/2014  . Poorly controlled type 2 diabetes mellitus with peripheral neuropathy (HCC) 06/05/2010  . Hyperlipidemia 03/06/2008  . PREMATURE VENTRICULAR CONTRACTIONS 11/07/2007    Past Surgical History:  Procedure Laterality Date  . APPENDECTOMY          Home Medications    Prior to Admission medications   Medication Sig Start Date End Date Taking? Authorizing Provider  Colchicine (MITIGARE) 0.6 MG CAPS 1 cap bid for 5-7 days. In the future 2 caps once with acute onset. 03/23/17   [place], Betty G, MD  doxycycline (VIBRA-TABS) 100 MG tablet Take 100 mg by mouth 2 (two) times  daily. 09/18/16   [provider]  glucose blood (ONETOUCH VERIO) test strip Use once daily for glucose control, dx 250.00 07/24/16   Carlus Pavlov, MD  KRILL OIL PO Take 1 capsule by mouth daily.    [provider]  Lancets [facility] ULTRASOFT) lancets Use once daily for glucose control. Dx 250.00 11/14/13   Roderick Pee, MD  lisinopril (PRINIVIL,ZESTRIL) 5 MG tablet TAKE AS INSTRUCTED BY YOUR PRESCRIBER 11/10/16   Roderick Pee, MD  MAGNESIUM PO Take 1 capsule by mouth 2 (two) times daily.    [provider]  metFORMIN (GLUCOPHAGE-XR) 500 MG 24 hr tablet TAKE 4 TABLETS (2000 MG) WITH DINNER 12/07/16   Carlus Pavlov, MD  Multiple Vitamins-Minerals (MULTIVITAMIN ADULTS) TABS Take 1 tablet by mouth at bedtime.    [provider]  Potassium 99 MG TABS Take 1 tablet by mouth daily.    [provider]  simvastatin (ZOCOR) 10 MG tablet TAKE 1 TABLET AT BEDTIME 11/10/16   Roderick Pee, MD  sitaGLIPtin (JANUVIA) 100 MG tablet Take 1 tablet (100 mg total) by mouth daily. 07/24/16   Carlus Pavlov, MD  traMADol Janean Sark) 50 MG tablet 1/2-1 tablet twice daily as needed for severe shoulder pain 12/19/14   Roderick Pee, MD    Family History History reviewed. No pertinent family history.  Social History Social History   Tobacco Use  .  Smoking status: Former Smoker    Last attempt to quit: 03/10/2007    Years since quitting: 10.3  . Smokeless tobacco: Never Used  Substance Use Topics  . Alcohol use: No  . Drug use: No     Allergies   Penicillins   Review of Systems Review of Systems  Constitutional: Negative for fever.  HENT: Negative for sinus pain.   Eyes: Negative for visual disturbance.  Respiratory: Negative for shortness of breath.        Pain with inspiration  Cardiovascular:       Chest wall pain  Gastrointestinal: Negative for abdominal pain, constipation, diarrhea, nausea and vomiting.  Genitourinary: Negative for flank  pain.  Musculoskeletal: Positive for back pain and neck pain.       Left shoulder pain  Skin:       abrasion  Neurological: Negative for syncope.       Abnormal sensation to left hand, no numbness/weakness to remainder of BUE and BLE, head trauma, no LOC, no headache, lightheadedness/dizziness     Physical Exam Updated Vital Signs BP (!) 150/101 (BP Location: Right Arm)   Pulse (!) 108   Temp 97.9 F (36.6 C) (Oral)   Resp 16   Ht 5\' 11"  (1.803 m)   Wt 115.7 kg (255 lb)   SpO2 96%   BMI 35.57 kg/m   Physical Exam  Constitutional: He is oriented to person, place, and time. He appears well-developed and well-nourished.  Pt distressed  HENT:  Right Ear: External ear normal.  Left Ear: External ear normal.  Nose: Nose normal.  Mouth/Throat: Oropharynx is clear and moist.  Large abrasion to superior aspect of head without active bleeding  Eyes: Pupils are equal, round, and reactive to light. Conjunctivae and EOM are normal.  Neck: Normal range of motion. Neck supple. No tracheal deviation present.  Cardiovascular: Normal rate, regular rhythm, normal heart sounds and intact distal pulses.  No murmur heard. Pulmonary/Chest: Effort normal and breath sounds normal. No respiratory distress. He has no wheezes.  Midline chest ttp, no crepitus or stepoff noted  Abdominal: Soft. Bowel sounds are normal. He exhibits no distension. There is no tenderness. There is no guarding.  No seat belt sign  Musculoskeletal: Normal range of motion.  ttp to cervical and upper thoracic spine, no stepoff, no ttp to lower thoracic or lumbar spine  Neurological: He is alert and oriented to person, place, and time.  Mental Status:  Alert, thought content appropriate, able to give a coherent history. Speech fluent without evidence of aphasia. Able to follow 2 step commands without difficulty.  Cranial Nerves:  II: pupils equal, round, reactive to light III,IV, VI: ptosis not present, extra-ocular motions  intact bilaterally  V,VII: smile symmetric, facial light touch sensation equal VIII: hearing grossly normal to voice  X: uvula elevates symmetrically  XI: bilateral shoulder shrug symmetric and strong XII: midline tongue extension without fassiculations Motor:  Normal tone. 5/5 strength of BUE and BLE major muscle groups including strong and equal grip strength and dorsiflexion/plantar flexion Sensory: abnormal sensation to index finger, thumb, and middle finger of left hand, normal sensation to remainder of extremities Gait: normal gait and balance.   CV: 2+ radial and DP/PT pulses  Skin: Skin is warm and dry. Capillary refill takes less than 2 seconds.  Psychiatric:  anxious  Nursing note and vitals reviewed.    ED Treatments / Results  Labs (all labs ordered are listed, but only abnormal results are displayed) Labs  Reviewed  BASIC METABOLIC PANEL - Abnormal; Notable for the following components:      Result Value   Glucose, Bld 245 (*)    All other components within normal limits  CBC WITH DIFFERENTIAL/PLATELET - Abnormal; Notable for the following components:   WBC 15.0 (*)    Neutro Abs 13.8 (*)    All other components within normal limits  TROPONIN I    EKG EKG Interpretation  Date/Time:  Friday July 02 2017 17:17:50 EDT Ventricular Rate:  117 PR Interval:    QRS Duration: 109 QT Interval:  318 QTC Calculation: 444 R Axis:   50 Text Interpretation:  Age not entered, assumed to be  47 years old for purpose of ECG interpretation Sinus tachycardia Low voltage, precordial leads No STEMI.  Confirmed by Alona Bene [id number]) on 07/02/2017 5:25:41 PM   Radiology Dg Chest 2 View  Result Date: 07/02/2017 CLINICAL DATA:  Larey Seat down steps, now with left shoulder pain, former smoking history EXAM: CHEST - 2 VIEW COMPARISON:  Chest x-ray of 11/02/2007 FINDINGS: The lungs are not optimally aerated. However no pneumonia is seen and there is no evidence of pneumothorax. No  pleural effusion is noted. Mediastinal and hilar contours are unremarkable. The heart is within upper limits of normal. No bony abnormality is noted. IMPRESSION: Suboptimal inspiration.  No definite active process. Electronically Signed   By: Dwyane Dee M.D.   On: 07/02/2017 15:43   Ct Head Wo Contrast  Result Date: 07/02/2017 CLINICAL DATA:  Fall backwards down steps with posterior head injury. Initial encounter. EXAM: CT HEAD WITHOUT CONTRAST CT CERVICAL SPINE WITHOUT CONTRAST TECHNIQUE: Multidetector CT imaging of the head and cervical spine was performed following the standard protocol without intravenous contrast. Multiplanar CT image reconstructions of the cervical spine were also generated. COMPARISON:  None. FINDINGS: CT HEAD FINDINGS Brain: No evidence of acute infarction, hemorrhage, hydrocephalus, extra-axial collection or mass lesion/mass effect. Vascular: No hyperdense vessel or unexpected calcification. Skull: Normal. Negative for fracture or focal lesion. Sinuses/Orbits: No acute finding. Other: Tiny fleck of density along the posterior left scalp near the vertex may represent a small foreign body on, or just within, the superficial scalp soft tissues. CT CERVICAL SPINE FINDINGS Alignment: The cervical spine demonstrates normal alignment without subluxation. Skull base and vertebrae: No evidence of acute fracture or focal bony lesions. Soft tissues and spinal canal: No soft tissue swelling identified. Disc levels: Moderate degenerative disc disease present at C5-6 and C6-7. Upper chest: Negative. IMPRESSION: 1. No evidence of acute brain injury or skull fracture. There is a tiny density along the posterior left scalp near the vertex which may represent a small foreign body on, or within, the superficial scalp. 2. No evidence of acute cervical injury. Moderate degenerative disc disease is present at C5-6 and C6-7. Electronically Signed   By: Irish Lack M.D.   On: 07/02/2017 16:00   Ct Chest  W Contrast  Result Date: 07/02/2017 CLINICAL DATA:  Larey Seat back worse today complaining of left shoulder and neck pain and mid sternal pain. EXAM: CT CHEST WITH CONTRAST TECHNIQUE: Multidetector CT imaging of the chest was performed during intravenous contrast administration. CONTRAST:  80mL ISOVUE-300 IOPAMIDOL (ISOVUE-300) INJECTION 61% COMPARISON:  Current chest radiograph FINDINGS: Cardiovascular: Heart normal in size and configuration. No pericardial effusion. No significant coronary artery calcifications. Great vessels are normal in caliber. No aortic dissection or atherosclerosis. Mediastinum/Nodes: No enlarged mediastinal, hilar, or axillary lymph nodes. Thyroid gland, trachea, and esophagus demonstrate no significant findings.  Lungs/Pleura: Mild centrilobular emphysema. Mild scarring noted the lung apices. There are mild dependent reticular opacities consistent with subsegmental atelectasis. No evidence of pneumonia or pulmonary edema. No lung mass or suspicious nodule. No pleural effusion or pneumothorax. Upper Abdomen: No acute abnormality. Musculoskeletal: No fractures.  No bone lesions. IMPRESSION: 1. No acute findings in the chest. 2. No fractures. 3. Mild centrilobular emphysema. Minor areas of lung scarring and dependent subsegmental atelectasis. Emphysema (ICD10-J43.9). Electronically Signed   By: Amie Portland M.D.   On: 07/02/2017 18:05   Ct Cervical Spine Wo Contrast  Result Date: 07/02/2017 CLINICAL DATA:  Fall backwards down steps with posterior head injury. Initial encounter. EXAM: CT HEAD WITHOUT CONTRAST CT CERVICAL SPINE WITHOUT CONTRAST TECHNIQUE: Multidetector CT imaging of the head and cervical spine was performed following the standard protocol without intravenous contrast. Multiplanar CT image reconstructions of the cervical spine were also generated. COMPARISON:  None. FINDINGS: CT HEAD FINDINGS Brain: No evidence of acute infarction, hemorrhage, hydrocephalus, extra-axial  collection or mass lesion/mass effect. Vascular: No hyperdense vessel or unexpected calcification. Skull: Normal. Negative for fracture or focal lesion. Sinuses/Orbits: No acute finding. Other: Tiny fleck of density along the posterior left scalp near the vertex may represent a small foreign body on, or just within, the superficial scalp soft tissues. CT CERVICAL SPINE FINDINGS Alignment: The cervical spine demonstrates normal alignment without subluxation. Skull base and vertebrae: No evidence of acute fracture or focal bony lesions. Soft tissues and spinal canal: No soft tissue swelling identified. Disc levels: Moderate degenerative disc disease present at C5-6 and C6-7. Upper chest: Negative. IMPRESSION: 1. No evidence of acute brain injury or skull fracture. There is a tiny density along the posterior left scalp near the vertex which may represent a small foreign body on, or within, the superficial scalp. 2. No evidence of acute cervical injury. Moderate degenerative disc disease is present at C5-6 and C6-7. Electronically Signed   By: Irish Lack M.D.   On: 07/02/2017 16:00   Dg Shoulder Left  Result Date: 07/02/2017 CLINICAL DATA:  Left shoulder pain after falling down steps today. EXAM: LEFT SHOULDER - 2+ VIEW COMPARISON:  None. FINDINGS: There is no evidence of fracture or dislocation. There is no evidence of arthropathy or other focal bone abnormality. Soft tissues are unremarkable. IMPRESSION: Negative. Electronically Signed   By: Francene Boyers M.D.   On: 07/02/2017 15:43    Procedures Procedures (including critical care time)  Medications Ordered in ED Medications  fentaNYL (SUBLIMAZE) injection 50 mcg (50 mcg Intramuscular Given 07/02/17 1502)  Tdap (BOOSTRIX) injection 0.5 mL (0.5 mLs Intramuscular Given 07/02/17 1503)  morphine 4 MG/ML injection 4 mg (4 mg Intramuscular Given 07/02/17 1600)  sodium chloride 0.9 % bolus 500 mL (0 mLs Intravenous Stopped 07/02/17 1731)  morphine 4 MG/ML  injection 4 mg (4 mg Intravenous Given 07/02/17 1641)  ondansetron (ZOFRAN) injection 4 mg (4 mg Intravenous Given 07/02/17 1641)  HYDROmorphone (DILAUDID) injection 1 mg (1 mg Intravenous Given 07/02/17 1731)  iopamidol (ISOVUE-300) 61 % injection 100 mL (80 mLs Intravenous Contrast Given 07/02/17 1741)  sodium chloride 0.9 % bolus 1,000 mL (0 mLs Intravenous Stopped 07/02/17 1918)  HYDROmorphone (DILAUDID) injection 1 mg (1 mg Intravenous Given 07/02/17 1920)  HYDROmorphone (DILAUDID) injection 1 mg (1 mg Intravenous Given 07/02/17 2124)     Initial Impression / Assessment and Plan / ED Course  I have reviewed the triage vital signs and the nursing notes.  Pertinent labs & imaging results that were  available during my care of the patient were reviewed by me and considered in my medical decision making (see chart for details).  Discussed pt presentation and exam findings with Dr. Jacqulyn Bath, who personally evaluated the pt and agrees with plan for transfer to [facility] for MR cervical spine to rule out ligamentous injury.     Reevaluated patient after receiving morphine.  He still appears uncomfortable and is moving around in the bed in mild distress.  He is alert and responsive.  Will give additional dose of morphine.  Reevaluation after receiving second dose of morphine and he still complaining of pain states the pain is worsening. He is alert and responsive. Will give additional dose of pain meds.  Describes pain as located to the posterior aspect of the left shoulder and left part of chest.  Is still complaining of pain with inspiration.  He is not tachycardic on the monitor in the 115 range.  His sats were also noted to be in the 90s by nursing staff and 2 L of oxygen applied.  CT chest with IV contrast will be ordered.  Pt still c/o pain after dilaudid. Will give additional dose and plan for transfer to [facility] for MR cervical spine to rule out ligamentous injury. If negative pt likely safe for  discharge.    Final Clinical Impressions(s) / ED Diagnoses   Final diagnoses:  Fall, initial encounter  Back pain, unspecified back location, unspecified back pain laterality, unspecified chronicity  Radiculopathy, unspecified spinal region   Pt presenting to the ED after mechanical fall backwards down a flight of stairs. Hypertensive initially, otherwise VSS initially.  Patient has been grossly normal neurologic exam.  Cardiopulmonary exam within normal limits.  Abdominal exam is benign.  ECG with sinus tach.  No ischemic changes or arrhythmias.  Troponin negative.  Lab work is grossly negative. CXR negative for widened mediastinum or pneumothorax. Left shoulder xray negative for acute fracture or dislocation. Ct cervical spine negative for acute bony pathology. Ct brain without evidence of hemorrhage or skull fracture. Possible FB noted to superficial scalp. None identified on exam.  Ct chest with IV contrast negative for acute traumatic injury. Pt still in pain after multiple doses of pain medication. Pain seems to be radicular in nature and pt is c/o paresthesias to left hand. No strength deficits noted on exam. given patients persistent pain and mechanism of injury feel that MR cervical spine is indicated to rule out ligamentous injury. If negative then patient is likely safe for discharge home if his pain is under control and his vital signs normalize.   Discussed case with Dr. Eudelia Bunch at [facility] who accepts the pt for transfer to ED.   ED Discharge Orders    None       Rayne Du 07/02/17 2239    Maia Plan, MD 07/03/17 [phone]

## 2017-07-02 NOTE — ED Triage Notes (Addendum)
Amb to triage w/o difficulty . Pt c/o fall x 1 hr ago down steps abrasion noted to top of head and " severe" upper  back pain, denies neck pain

## 2017-07-02 NOTE — ED Notes (Signed)
Patient transported to CT 

## 2017-07-02 NOTE — ED Notes (Signed)
Report to Thayer Ohmhris, Consulting civil engineercharge RN at [facility]

## 2017-07-03 ENCOUNTER — Observation Stay (HOSPITAL_COMMUNITY): Payer: BLUE CROSS/BLUE SHIELD

## 2017-07-03 ENCOUNTER — Encounter (HOSPITAL_COMMUNITY): Payer: Self-pay | Admitting: Internal Medicine

## 2017-07-03 ENCOUNTER — Observation Stay (HOSPITAL_COMMUNITY): Payer: BLUE CROSS/BLUE SHIELD | Admitting: Certified Registered"

## 2017-07-03 ENCOUNTER — Encounter (HOSPITAL_COMMUNITY)
Admission: EM | Disposition: A | Discharge status: Home / Self Care | Payer: Self-pay | Source: Home / Self Care | Attending: Emergency Medicine

## 2017-07-03 DIAGNOSIS — Z01818 Encounter for other preprocedural examination: Secondary | ICD-10-CM | POA: Diagnosis not present

## 2017-07-03 DIAGNOSIS — I10 Essential (primary) hypertension: Secondary | ICD-10-CM

## 2017-07-03 DIAGNOSIS — D72829 Elevated white blood cell count, unspecified: Secondary | ICD-10-CM | POA: Diagnosis present

## 2017-07-03 DIAGNOSIS — S129XXA Fracture of neck, unspecified, initial encounter: Secondary | ICD-10-CM | POA: Diagnosis present

## 2017-07-03 DIAGNOSIS — M4722 Other spondylosis with radiculopathy, cervical region: Secondary | ICD-10-CM | POA: Diagnosis not present

## 2017-07-03 DIAGNOSIS — E1142 Type 2 diabetes mellitus with diabetic polyneuropathy: Secondary | ICD-10-CM

## 2017-07-03 DIAGNOSIS — S13170A Subluxation of C6/C7 cervical vertebrae, initial encounter: Secondary | ICD-10-CM | POA: Diagnosis not present

## 2017-07-03 DIAGNOSIS — W108XXA Fall (on) (from) other stairs and steps, initial encounter: Secondary | ICD-10-CM | POA: Diagnosis not present

## 2017-07-03 DIAGNOSIS — S12600A Unspecified displaced fracture of seventh cervical vertebra, initial encounter for closed fracture: Secondary | ICD-10-CM | POA: Diagnosis present

## 2017-07-03 DIAGNOSIS — S134XXA Sprain of ligaments of cervical spine, initial encounter: Secondary | ICD-10-CM

## 2017-07-03 DIAGNOSIS — M50023 Cervical disc disorder at C6-C7 level with myelopathy: Secondary | ICD-10-CM | POA: Diagnosis not present

## 2017-07-03 DIAGNOSIS — M4712 Other spondylosis with myelopathy, cervical region: Secondary | ICD-10-CM | POA: Diagnosis not present

## 2017-07-03 DIAGNOSIS — M4802 Spinal stenosis, cervical region: Secondary | ICD-10-CM | POA: Diagnosis not present

## 2017-07-03 DIAGNOSIS — M50022 Cervical disc disorder at C5-C6 level with myelopathy: Secondary | ICD-10-CM | POA: Diagnosis not present

## 2017-07-03 HISTORY — PX: ANTERIOR CERVICAL DECOMP/DISCECTOMY FUSION: SHX1161

## 2017-07-03 HISTORY — DX: Essential (primary) hypertension: I10

## 2017-07-03 LAB — BASIC METABOLIC PANEL
ANION GAP: 12 (ref 5–15)
BUN: 12 mg/dL (ref 6–20)
CALCIUM: 9.1 mg/dL (ref 8.9–10.3)
CO2: 25 mmol/L (ref 22–32)
Chloride: 98 mmol/L — ABNORMAL LOW (ref 101–111)
Creatinine, Ser: 0.76 mg/dL (ref 0.61–1.24)
GFR calc Af Amer: >60 mL/min (ref >60)
GLUCOSE: 230 mg/dL — AB (ref 65–99)
POTASSIUM: 4.1 mmol/L (ref 3.5–5.1)
SODIUM: 135 mmol/L (ref 135–145)

## 2017-07-03 LAB — CBC
HCT: 45.2 % (ref 39.0–52.0)
Hemoglobin: 15.4 g/dL (ref 13.0–17.0)
MCH: 31.6 pg (ref 26.0–34.0)
MCHC: 34.1 g/dL (ref 30.0–36.0)
MCV: 92.6 fL (ref 78.0–100.0)
Platelets: 264 K/uL (ref 150–400)
RBC: 4.88 MIL/uL (ref 4.22–5.81)
RDW: 13.3 % (ref 11.5–15.5)
WBC: 12.2 K/uL — ABNORMAL HIGH (ref 4.0–10.5)

## 2017-07-03 LAB — CBG MONITORING, ED
GLUCOSE-CAPILLARY: 191 mg/dL — AB (ref 65–99)
Glucose-Capillary: 236 mg/dL — ABNORMAL HIGH (ref 65–99)

## 2017-07-03 LAB — PROTIME-INR
INR: 1.09
Prothrombin Time: 14 seconds (ref 11.4–15.2)

## 2017-07-03 LAB — GLUCOSE, CAPILLARY
GLUCOSE-CAPILLARY: 198 mg/dL — AB (ref 65–99)
GLUCOSE-CAPILLARY: 233 mg/dL — AB (ref 65–99)
GLUCOSE-CAPILLARY: 341 mg/dL — AB (ref 65–99)
Glucose-Capillary: 282 mg/dL — ABNORMAL HIGH (ref 65–99)

## 2017-07-03 LAB — APTT: APTT: 26 s (ref 24–36)

## 2017-07-03 LAB — HIV ANTIBODY (ROUTINE TESTING W REFLEX): HIV Screen 4th Generation wRfx: NONREACTIVE

## 2017-07-03 LAB — MAGNESIUM: Magnesium: 2.1 mg/dL (ref 1.7–2.4)

## 2017-07-03 SURGERY — ANTERIOR CERVICAL DECOMPRESSION/DISCECTOMY FUSION 2 LEVELS
Anesthesia: General | Site: Neck

## 2017-07-03 MED ORDER — CYCLOBENZAPRINE HCL 10 MG PO TABS
ORAL_TABLET | ORAL | Status: AC
  Filled 2017-07-03: qty 1

## 2017-07-03 MED ORDER — OXYCODONE HCL 5 MG PO TABS: 5.0000 mg | ORAL_TABLET | ORAL | Status: DC | PRN

## 2017-07-03 MED ORDER — MORPHINE SULFATE (PF) 4 MG/ML IV SOLN: 4.0000 mg | INTRAVENOUS | Status: DC | PRN

## 2017-07-03 MED ORDER — HYDROMORPHONE HCL 2 MG/ML IJ SOLN
1.0000 mg | INTRAMUSCULAR | Status: DC | PRN
  Administered 2017-07-03 (×3): 1 mg via INTRAVENOUS
  Filled 2017-07-03 (×2): qty 1

## 2017-07-03 MED ORDER — PROPOFOL 10 MG/ML IV BOLUS
INTRAVENOUS | Status: AC
  Filled 2017-07-03: qty 20

## 2017-07-03 MED ORDER — CEFAZOLIN SODIUM-DEXTROSE 2-4 GM/100ML-% IV SOLN
INTRAVENOUS | Status: AC
  Filled 2017-07-03: qty 100

## 2017-07-03 MED ORDER — LIDOCAINE 2% (20 MG/ML) 5 ML SYRINGE
INTRAMUSCULAR | Status: DC | PRN
  Administered 2017-07-03: 100 mg via INTRAVENOUS

## 2017-07-03 MED ORDER — SODIUM CHLORIDE 0.9 % IJ SOLN
INTRAMUSCULAR | Status: AC
  Filled 2017-07-03: qty 10

## 2017-07-03 MED ORDER — HYDROMORPHONE HCL 2 MG/ML IJ SOLN
INTRAMUSCULAR | Status: AC
  Filled 2017-07-03: qty 1

## 2017-07-03 MED ORDER — ACETAMINOPHEN 500 MG PO TABS
1000.0000 mg | ORAL_TABLET | Freq: Four times a day (QID) | ORAL | Status: AC
  Administered 2017-07-03 – 2017-07-04 (×4): 1000 mg via ORAL
  Filled 2017-07-03 (×4): qty 2

## 2017-07-03 MED ORDER — OXYCODONE HCL 5 MG PO TABS
ORAL_TABLET | ORAL | Status: AC
  Filled 2017-07-03: qty 2

## 2017-07-03 MED ORDER — HYDRALAZINE HCL 20 MG/ML IJ SOLN: 10.0000 mg | Freq: Three times a day (TID) | INTRAMUSCULAR | Status: DC | PRN

## 2017-07-03 MED ORDER — LISINOPRIL 5 MG PO TABS: 5.0000 mg | ORAL_TABLET | Freq: Every day | ORAL | Status: DC

## 2017-07-03 MED ORDER — MIDAZOLAM HCL 5 MG/5ML IJ SOLN
INTRAMUSCULAR | Status: DC | PRN
  Administered 2017-07-03: 2 mg via INTRAVENOUS

## 2017-07-03 MED ORDER — BACITRACIN ZINC 500 UNIT/GM EX OINT
TOPICAL_OINTMENT | CUTANEOUS | Status: DC | PRN
  Administered 2017-07-03: 1 via TOPICAL

## 2017-07-03 MED ORDER — SUCCINYLCHOLINE CHLORIDE 200 MG/10ML IV SOSY
PREFILLED_SYRINGE | INTRAVENOUS | Status: AC
  Filled 2017-07-03: qty 10

## 2017-07-03 MED ORDER — LIDOCAINE 2% (20 MG/ML) 5 ML SYRINGE
INTRAMUSCULAR | Status: AC
  Filled 2017-07-03: qty 5

## 2017-07-03 MED ORDER — BISACODYL 10 MG RE SUPP: 10.0000 mg | Freq: Every day | RECTAL | Status: DC | PRN

## 2017-07-03 MED ORDER — LACTATED RINGERS IV SOLN
INTRAVENOUS | Status: DC | PRN
  Administered 2017-07-03 (×2): via INTRAVENOUS

## 2017-07-03 MED ORDER — BUPIVACAINE-EPINEPHRINE (PF) 0.5% -1:200000 IJ SOLN
INTRAMUSCULAR | Status: AC
  Filled 2017-07-03: qty 30

## 2017-07-03 MED ORDER — SODIUM CHLORIDE 0.9 % IV SOLN
INTRAVENOUS | Status: DC | PRN
  Administered 2017-07-03: 500 mL

## 2017-07-03 MED ORDER — SODIUM CHLORIDE 0.9% FLUSH
3.0000 mL | Freq: Two times a day (BID) | INTRAVENOUS | Status: DC
  Administered 2017-07-03: 3 mL via INTRAVENOUS

## 2017-07-03 MED ORDER — SODIUM CHLORIDE 0.9% FLUSH
3.0000 mL | INTRAVENOUS | Status: DC | PRN
  Administered 2017-07-03: 3 mL via INTRAVENOUS
  Filled 2017-07-03: qty 3

## 2017-07-03 MED ORDER — LABETALOL HCL 5 MG/ML IV SOLN
INTRAVENOUS | Status: DC | PRN
  Administered 2017-07-03: 10 mg via INTRAVENOUS

## 2017-07-03 MED ORDER — FAMOTIDINE IN NACL 20-0.9 MG/50ML-% IV SOLN
20.0000 mg | Freq: Two times a day (BID) | INTRAVENOUS | Status: DC
  Administered 2017-07-03: 20 mg via INTRAVENOUS
  Filled 2017-07-03: qty 50

## 2017-07-03 MED ORDER — THROMBIN (RECOMBINANT) 5000 UNITS EX SOLR
OROMUCOSAL | Status: DC | PRN
  Administered 2017-07-03: 5 mL via TOPICAL

## 2017-07-03 MED ORDER — ONDANSETRON HCL 4 MG/2ML IJ SOLN: 4.0000 mg | Freq: Four times a day (QID) | INTRAMUSCULAR | Status: DC | PRN

## 2017-07-03 MED ORDER — PHENOL 1.4 % MT LIQD: 1.0000 | OROMUCOSAL | Status: DC | PRN

## 2017-07-03 MED ORDER — THROMBIN 5000 UNITS EX SOLR
CUTANEOUS | Status: AC
  Filled 2017-07-03: qty 5000

## 2017-07-03 MED ORDER — BUPIVACAINE-EPINEPHRINE (PF) 0.5% -1:200000 IJ SOLN
INTRAMUSCULAR | Status: DC | PRN
  Administered 2017-07-03: 10 mL

## 2017-07-03 MED ORDER — ACETAMINOPHEN 650 MG RE SUPP: 650.0000 mg | RECTAL | Status: DC | PRN

## 2017-07-03 MED ORDER — ROCURONIUM BROMIDE 10 MG/ML (PF) SYRINGE
PREFILLED_SYRINGE | INTRAVENOUS | Status: DC | PRN
  Administered 2017-07-03: 50 mg via INTRAVENOUS

## 2017-07-03 MED ORDER — 0.9 % SODIUM CHLORIDE (POUR BTL) OPTIME
TOPICAL | Status: DC | PRN
  Administered 2017-07-03: 1000 mL

## 2017-07-03 MED ORDER — ONDANSETRON HCL 4 MG PO TABS: 4.0000 mg | ORAL_TABLET | Freq: Four times a day (QID) | ORAL | Status: DC | PRN

## 2017-07-03 MED ORDER — ENOXAPARIN SODIUM 40 MG/0.4ML ~~LOC~~ SOLN
40.0000 mg | Freq: Every day | SUBCUTANEOUS | Status: DC
  Filled 2017-07-03: qty 0.4

## 2017-07-03 MED ORDER — ONDANSETRON HCL 4 MG/2ML IJ SOLN
INTRAMUSCULAR | Status: DC | PRN
  Administered 2017-07-03: 4 mg via INTRAVENOUS

## 2017-07-03 MED ORDER — INSULIN ASPART 100 UNIT/ML ~~LOC~~ SOLN
SUBCUTANEOUS | Status: AC
  Filled 2017-07-03: qty 1

## 2017-07-03 MED ORDER — SURGIFOAM 100 EX MISC
CUTANEOUS | Status: DC | PRN
  Administered 2017-07-03: 20 mL via TOPICAL

## 2017-07-03 MED ORDER — ROCURONIUM BROMIDE 10 MG/ML (PF) SYRINGE
PREFILLED_SYRINGE | INTRAVENOUS | Status: AC
  Filled 2017-07-03: qty 5

## 2017-07-03 MED ORDER — METFORMIN HCL ER 500 MG PO TB24
500.0000 mg | ORAL_TABLET | Freq: Two times a day (BID) | ORAL | Status: DC
  Administered 2017-07-03 – 2017-07-04 (×2): 500 mg via ORAL
  Filled 2017-07-03 (×2): qty 1

## 2017-07-03 MED ORDER — SUFENTANIL CITRATE 50 MCG/ML IV SOLN
INTRAVENOUS | Status: AC
  Filled 2017-07-03: qty 1

## 2017-07-03 MED ORDER — ALUM & MAG HYDROXIDE-SIMETH 200-200-20 MG/5ML PO SUSP: 30.0000 mL | Freq: Four times a day (QID) | ORAL | Status: DC | PRN

## 2017-07-03 MED ORDER — INSULIN ASPART 100 UNIT/ML ~~LOC~~ SOLN
0.0000 [IU] | SUBCUTANEOUS | Status: DC
  Administered 2017-07-03: 2 [IU] via SUBCUTANEOUS
  Administered 2017-07-03: 3 [IU] via SUBCUTANEOUS
  Filled 2017-07-03 (×2): qty 1

## 2017-07-03 MED ORDER — PROPOFOL 10 MG/ML IV BOLUS
INTRAVENOUS | Status: DC | PRN
  Administered 2017-07-03: 140 mg via INTRAVENOUS

## 2017-07-03 MED ORDER — VANCOMYCIN HCL IN DEXTROSE 1-5 GM/200ML-% IV SOLN
1000.0000 mg | Freq: Once | INTRAVENOUS | Status: AC
  Administered 2017-07-03: 1000 mg via INTRAVENOUS
  Filled 2017-07-03: qty 200

## 2017-07-03 MED ORDER — DOCUSATE SODIUM 100 MG PO CAPS
100.0000 mg | ORAL_CAPSULE | Freq: Two times a day (BID) | ORAL | Status: DC
  Administered 2017-07-03 – 2017-07-04 (×2): 100 mg via ORAL
  Filled 2017-07-03 (×2): qty 1

## 2017-07-03 MED ORDER — IPRATROPIUM-ALBUTEROL 0.5-2.5 (3) MG/3ML IN SOLN: 3.0000 mL | Freq: Four times a day (QID) | RESPIRATORY_TRACT | Status: DC | PRN

## 2017-07-03 MED ORDER — DEXAMETHASONE 2 MG PO TABS
2.0000 mg | ORAL_TABLET | Freq: Four times a day (QID) | ORAL | Status: AC
  Administered 2017-07-04: 2 mg via ORAL
  Filled 2017-07-03: qty 1

## 2017-07-03 MED ORDER — DEXAMETHASONE SODIUM PHOSPHATE 4 MG/ML IJ SOLN
2.0000 mg | Freq: Four times a day (QID) | INTRAMUSCULAR | Status: AC
  Administered 2017-07-03: 2 mg via INTRAVENOUS
  Filled 2017-07-03: qty 1

## 2017-07-03 MED ORDER — ONDANSETRON HCL 4 MG/2ML IJ SOLN
INTRAMUSCULAR | Status: AC
  Filled 2017-07-03: qty 2

## 2017-07-03 MED ORDER — ACETAMINOPHEN 325 MG PO TABS: 650.0000 mg | ORAL_TABLET | ORAL | Status: DC | PRN

## 2017-07-03 MED ORDER — SIMVASTATIN 5 MG PO TABS: 10.0000 mg | ORAL_TABLET | Freq: Every day | ORAL | Status: DC

## 2017-07-03 MED ORDER — CYCLOBENZAPRINE HCL 10 MG PO TABS
10.0000 mg | ORAL_TABLET | Freq: Three times a day (TID) | ORAL | Status: DC | PRN
  Administered 2017-07-03: 10 mg via ORAL
  Filled 2017-07-03: qty 1

## 2017-07-03 MED ORDER — SODIUM CHLORIDE 0.9 % IV SOLN
INTRAVENOUS | Status: DC
  Administered 2017-07-03: 05:00:00 via INTRAVENOUS

## 2017-07-03 MED ORDER — INSULIN ASPART 100 UNIT/ML ~~LOC~~ SOLN
8.0000 [IU] | Freq: Once | SUBCUTANEOUS | Status: AC
Start: 2017-07-03 — End: 2017-07-03
  Administered 2017-07-03: 8 [IU] via SUBCUTANEOUS

## 2017-07-03 MED ORDER — MIDAZOLAM HCL 2 MG/2ML IJ SOLN
INTRAMUSCULAR | Status: AC
  Filled 2017-07-03: qty 2

## 2017-07-03 MED ORDER — LABETALOL HCL 5 MG/ML IV SOLN
INTRAVENOUS | Status: AC
  Filled 2017-07-03: qty 4

## 2017-07-03 MED ORDER — OXYCODONE HCL 5 MG PO TABS
10.0000 mg | ORAL_TABLET | ORAL | Status: DC | PRN
  Administered 2017-07-03 (×2): 10 mg via ORAL
  Filled 2017-07-03: qty 2

## 2017-07-03 MED ORDER — HYDROMORPHONE HCL 2 MG/ML IJ SOLN
1.0000 mg | Freq: Once | INTRAMUSCULAR | Status: AC
  Administered 2017-07-03: 1 mg via INTRAVENOUS
  Filled 2017-07-03: qty 1

## 2017-07-03 MED ORDER — DEXAMETHASONE SODIUM PHOSPHATE 10 MG/ML IJ SOLN
INTRAMUSCULAR | Status: DC | PRN
  Administered 2017-07-03: 5 mg via INTRAVENOUS

## 2017-07-03 MED ORDER — SUFENTANIL CITRATE 50 MCG/ML IV SOLN
INTRAVENOUS | Status: DC | PRN
  Administered 2017-07-03: 20 ug via INTRAVENOUS
  Administered 2017-07-03 (×2): 10 ug via INTRAVENOUS

## 2017-07-03 MED ORDER — THROMBIN 20000 UNITS EX SOLR
CUTANEOUS | Status: AC
  Filled 2017-07-03: qty 20000

## 2017-07-03 MED ORDER — VANCOMYCIN HCL IN DEXTROSE 1-5 GM/200ML-% IV SOLN
INTRAVENOUS | Status: AC
  Filled 2017-07-03: qty 200

## 2017-07-03 MED ORDER — MENTHOL 3 MG MT LOZG: 1.0000 | LOZENGE | OROMUCOSAL | Status: DC | PRN

## 2017-07-03 MED ORDER — INSULIN ASPART 100 UNIT/ML ~~LOC~~ SOLN
0.0000 [IU] | SUBCUTANEOUS | Status: DC
  Administered 2017-07-03: 15 [IU] via SUBCUTANEOUS
  Administered 2017-07-03: 7 [IU] via SUBCUTANEOUS
  Administered 2017-07-04: 15 [IU] via SUBCUTANEOUS
  Administered 2017-07-04: 7 [IU] via SUBCUTANEOUS
  Administered 2017-07-04: 11 [IU] via SUBCUTANEOUS

## 2017-07-03 MED ORDER — BACITRACIN ZINC 500 UNIT/GM EX OINT
TOPICAL_OINTMENT | CUTANEOUS | Status: AC
  Filled 2017-07-03: qty 28.35

## 2017-07-03 MED ORDER — DEXAMETHASONE SODIUM PHOSPHATE 10 MG/ML IJ SOLN
INTRAMUSCULAR | Status: AC
  Filled 2017-07-03: qty 1

## 2017-07-03 MED ORDER — VANCOMYCIN HCL 1000 MG IV SOLR
INTRAVENOUS | Status: DC | PRN
  Administered 2017-07-03: 1000 mg via INTRAVENOUS

## 2017-07-03 SURGICAL SUPPLY — 62 items
APL SKNCLS STERI-STRIP NONHPOA (GAUZE/BANDAGES/DRESSINGS) ×1
BAG DECANTER FOR FLEXI CONT (MISCELLANEOUS) ×2 IMPLANT
BENZOIN TINCTURE PRP APPL 2/3 (GAUZE/BANDAGES/DRESSINGS) ×3 IMPLANT
BIT DRILL NEURO 2X3.1 SFT TUCH (MISCELLANEOUS) ×1 IMPLANT
BLADE SURG 15 STRL LF DISP TIS (BLADE) ×1 IMPLANT
BLADE SURG 15 STRL SS (BLADE) ×2
BLADE ULTRA TIP 2M (BLADE) ×2 IMPLANT
BUR BARREL STRAIGHT FLUTE 4.0 (BURR) ×2 IMPLANT
BUR MATCHSTICK NEURO 3.0 LAGG (BURR) ×2 IMPLANT
CANISTER SUCT 3000ML PPV (MISCELLANEOUS) ×2 IMPLANT
CARTRIDGE OIL MAESTRO DRILL (MISCELLANEOUS) ×1 IMPLANT
CLSR STERI-STRIP ANTIMIC 1/2X4 (GAUZE/BANDAGES/DRESSINGS) ×1 IMPLANT
COVER MAYO STAND STRL (DRAPES) ×2 IMPLANT
DECANTER SPIKE VIAL GLASS SM (MISCELLANEOUS) ×2 IMPLANT
DEVICE FUSION VIST S 14X14X6MM (Trauma) ×2 IMPLANT
DIFFUSER DRILL AIR PNEUMATIC (MISCELLANEOUS) ×2 IMPLANT
DRAPE LAPAROTOMY 100X72 PEDS (DRAPES) ×2 IMPLANT
DRAPE MICROSCOPE LEICA (MISCELLANEOUS) IMPLANT
DRAPE POUCH INSTRU U-SHP 10X18 (DRAPES) ×2 IMPLANT
DRAPE SURG 17X23 STRL (DRAPES) ×4 IMPLANT
DRILL NEURO 2X3.1 SOFT TOUCH (MISCELLANEOUS) ×2
DRSG OPSITE POSTOP 4X6 (GAUZE/BANDAGES/DRESSINGS) ×2 IMPLANT
ELECT REM PT RETURN 9FT ADLT (ELECTROSURGICAL) ×2
ELECTRODE REM PT RTRN 9FT ADLT (ELECTROSURGICAL) ×1 IMPLANT
GAUZE SPONGE 4X4 12PLY STRL (GAUZE/BANDAGES/DRESSINGS) ×2 IMPLANT
GAUZE SPONGE 4X4 16PLY XRAY LF (GAUZE/BANDAGES/DRESSINGS) IMPLANT
GLOVE BIO SURGEON STRL SZ7 (GLOVE) ×4 IMPLANT
GLOVE BIO SURGEON STRL SZ8 (GLOVE) ×3 IMPLANT
GLOVE BIO SURGEON STRL SZ8.5 (GLOVE) ×2 IMPLANT
GLOVE BIOGEL PI IND STRL 7.5 (GLOVE) IMPLANT
GLOVE BIOGEL PI INDICATOR 7.5 (GLOVE) ×2
GLOVE EXAM NITRILE LRG STRL (GLOVE) IMPLANT
GLOVE EXAM NITRILE XL STR (GLOVE) IMPLANT
GLOVE EXAM NITRILE XS STR PU (GLOVE) IMPLANT
GOWN STRL REUS W/ TWL LRG LVL3 (GOWN DISPOSABLE) IMPLANT
GOWN STRL REUS W/ TWL XL LVL3 (GOWN DISPOSABLE) IMPLANT
GOWN STRL REUS W/TWL LRG LVL3 (GOWN DISPOSABLE) ×4
GOWN STRL REUS W/TWL XL LVL3 (GOWN DISPOSABLE) ×4
HEMOSTAT POWDER KIT SURGIFOAM (HEMOSTASIS) ×2 IMPLANT
KIT BASIN OR (CUSTOM PROCEDURE TRAY) ×2 IMPLANT
KIT TURNOVER KIT B (KITS) ×2 IMPLANT
MARKER SKIN DUAL TIP RULER LAB (MISCELLANEOUS) ×2 IMPLANT
NEEDLE HYPO 22GX1.5 SAFETY (NEEDLE) ×2 IMPLANT
NEEDLE SPNL 18GX3.5 QUINCKE PK (NEEDLE) ×2 IMPLANT
NS IRRIG 1000ML POUR BTL (IV SOLUTION) ×2 IMPLANT
OIL CARTRIDGE MAESTRO DRILL (MISCELLANEOUS) ×2
PACK LAMINECTOMY NEURO (CUSTOM PROCEDURE TRAY) ×2 IMPLANT
PIN DISTRACTION 14MM (PIN) ×4 IMPLANT
PLATE ANT CERV XTEND 2 LV 30 (Plate) ×1 IMPLANT
PUTTY KINEX BIOACTIVE 5CC (Bone Implant) ×1 IMPLANT
RUBBERBAND STERILE (MISCELLANEOUS) IMPLANT
SCREW XTD VAR 4.2 SELF TAP (Screw) ×6 IMPLANT
SPONGE INTESTINAL PEANUT (DISPOSABLE) ×4 IMPLANT
SPONGE SURGIFOAM ABS GEL SZ50 (HEMOSTASIS) IMPLANT
STRIP CLOSURE SKIN 1/2X4 (GAUZE/BANDAGES/DRESSINGS) ×2 IMPLANT
SUT VIC AB 0 CT1 27 (SUTURE) ×2
SUT VIC AB 0 CT1 27XBRD ANTBC (SUTURE) ×1 IMPLANT
SUT VIC AB 3-0 SH 8-18 (SUTURE) ×2 IMPLANT
TOWEL GREEN STERILE (TOWEL DISPOSABLE) ×2 IMPLANT
TOWEL GREEN STERILE FF (TOWEL DISPOSABLE) ×2 IMPLANT
VISTA S O 14X14X6MM (Trauma) ×4 IMPLANT
WATER STERILE IRR 1000ML POUR (IV SOLUTION) ×2 IMPLANT

## 2017-07-03 NOTE — Progress Notes (Signed)
Pharmacy Antibiotic Note  Nicholas [name] is a 47 y.o. male admitted on 07/02/2017 with surgical prophylaxis.  Pharmacy has been consulted for Vancomycin dosing. Patient does not have a drain in place.  Plan: -Vancomycin 1 gm IV once 12 hours after previous dose x 1  -Pharmacy to sign off since no further doses necessary   Height:  (180.3 cm) Weight: 255 lb (115.7 kg) IBW/kg (Calculated) : 75.3  Temp (24hrs), Avg:98 F (36.7 C), Min:97.3 F (36.3 C), Max:98.8 F (37.1 C)  Recent Labs  Lab 07/02/17 1652 07/03/17 0506  WBC 15.0* 12.2*  CREATININE 0.84 0.76    Estimated Creatinine Clearance: 147.7 mL/min (by C-G formula based on SCr of 0.76 mg/dL).    Allergies  Allergen Reactions  . Penicillins Rash     Thank you for allowing pharmacy to be a part of this patient's care.  Vinnie Level, PharmD., BCPS Clinical Pharmacist If after 3:30pm, please call main pharmacy at: [phone]

## 2017-07-03 NOTE — Progress Notes (Signed)
Patient admitted after midnight, please see H&P.  Here with an acute cervical spine fracture after a fall.  Dr. Lovell Sheehan plans surgery today.  NPO.  Treat pain and BP issues.  Marlin Canary DO

## 2017-07-03 NOTE — Progress Notes (Signed)
Orthopedic Tech Progress Note Patient Details:  Nicholas [name] 1970-05-18 [id number]  Patient ID: [name], male   DOB: Aug 08, 1970, 47 y.o.   MRN: [id number] Pt already has sling immobilizer. Trinna Post 07/03/2017, 6:20 AM

## 2017-07-03 NOTE — ED Notes (Signed)
Pt refused shoulder sling stating he felt better when his am was raised above his head.

## 2017-07-03 NOTE — Op Note (Signed)
Brief history: The patient is a 47 year old white male whose had chronic neck and shoulder pain.  He took a fall down some steps yesterday morning and had severe neck and arm pain.  He was worked up with a cervical CT and a cervical MRI which demonstrated C5-6 and C6-7 disc degeneration, spondylosis, herniated disks, fracture subluxation, etc.  I discussed the various treatment options with the patient and recommended surgery.  He has weighed the risks, benefits and alternatives of surgery and decided proceed with a C5-6 and C6-7 anterior cervical discectomy, fusion and plating.  Preoperative diagnosis: C5-6 degeneration, herniated disc, spondylosis, stenosis, C6-7 herniated disc, stenosis, fracture, subluxation, cervicalgia, cervical radiculopathy, cervical myelopathy  Postoperative diagnosis: The same  Procedure: C5-6 and C6-7 anterior cervical discectomy/decompression; C5-6 and C6-7 interbody arthrodesis with local morcellized autograft bone and Kinnex bone graft extender; insertion of interbody prosthesis at C5-6 and C6-7 (Zimmer peek interbody prosthesis); anterior cervical plating from C5 to C7 with globus titanium plate; open reduction internal fixation of cervical fracture  Surgeon: Dr. Delma Officer  Asst.: Dr. Marikay Alar  Anesthesia: Gen. endotracheal  Estimated blood loss: 125 cc  Drains: None  Complications: None  Description of procedure: The patient was brought to the operating room by the anesthesia team. General endotracheal anesthesia was induced. A roll was placed under the patient's shoulders to keep the neck in the neutral position. The patient's anterior cervical region was then prepared with Betadine scrub and Betadine solution. Sterile drapes were applied.  The area to be incised was then injected with Marcaine with epinephrine solution. I then used a scalpel to make a transverse incision in the patient's left anterior neck. I used the Metzenbaum scissors to divide the  platysmal muscle and then to dissect medial to the sternocleidomastoid muscle, jugular vein, and carotid artery. I carefully dissected down towards the anterior cervical spine identifying the esophagus and retracting it medially. Then using Kitner swabs to clear soft tissue from the anterior cervical spine.  We could see the anterior subluxation of C6 on C7.  We then inserted a bent spinal needle into the upper exposed intervertebral disc space. We then obtained intraoperative radiographs confirm our location.  I then used electrocautery to detach the medial border of the longus colli muscle bilaterally from the C5-6 and C6-7 intervertebral disc spaces. I then inserted the Caspar self-retaining retractor underneath the longus colli muscle bilaterally to provide exposure.  We then incised the intervertebral disc at C6-7.  A bit of the anterior left C7 appear endplate was fractured.  We then performed a partial intervertebral discectomy with a pituitary forceps and the Karlin curettes. I then inserted distraction screws into the vertebral bodies at C6 and C7. We then distracted the interspace. We then used the high-speed drill to decorticate the vertebral endplates at C6 and C7, to drill away the remainder of the intervertebral disc, to drill away some posterior spondylosis, and to thin out the posterior longitudinal ligament. I then incised ligament with the arachnoid knife. We then removed the ligament with a Kerrison punches undercutting the vertebral endplates and decompressing the thecal sac. We then performed foraminotomies about the bilateral C7 nerve roots.  The subluxation appeared to reduce well.  This completed the decompression at this level.  We then repeated this procedure and now was fashioned at C5-6 decompressing the thecal sac in the bilateral C7 nerve root.  We now turned our to attention to the interbody fusion. We used the trial spacers to determine the appropriate  size for the interbody  prosthesis. We then pre-filled prosthesis with a combination of local morcellized autograft bone that we obtained during decompression as well as Kinnex bone graft extender. We then inserted the prosthesis into the distracted interspace at C5-6 and C6-7. We then removed the distraction screws. There was a good snug fit of the prosthesis in the interspace.  Having completed the fusion we now turned attention to the anterior spinal instrumentation. We used the high-speed drill to drill away some anterior spondylosis at the disc spaces so that the plate lay down flat. We selected the appropriate length titanium anterior cervical plate. We laid it along the anterior aspect of the vertebral bodies from C5-C7. We then drilled 14 mm holes at c5, C6 and C7. We then secured the plate to the vertebral bodies by placing two 14 mm self-tapping screws at C5, C6 and C7.  We got good bony purchase.  We then obtained intraoperative radiograph.  There was limited visualization of the septation because of the patient's shoulders but it looked good in vivo.  We therefore secured the screws the plate the locking each cam. This completed the instrumentation.  We then obtained hemostasis using bipolar electrocautery. We irrigated the wound out with bacitracin solution. We then removed the retractor. We inspected the esophagus for any damage. There was none apparent. We then reapproximated patient's platysmal muscle with interrupted 3-0 Vicryl suture. We then reapproximated the subcutaneous tissue with interrupted 3-0 Vicryl suture. The skin was reapproximated with Steri-Strips and benzoin. The wound was then covered with bacitracin ointment. A sterile dressing was applied. The drapes were removed. Patient was subsequently extubated by the anesthesia team and transported to the post anesthesia care unit in stable condition. All sponge instrument and needle counts were reportedly correct at the end of this case.

## 2017-07-03 NOTE — H&P (Signed)
History and Physical    Nicholas [name] ZOX:[id number] DOB: 02/16/1971 DOA: 07/02/2017  Referring MD/NP/PA:Dr. Tommas Olp) PCP: Nicholas Pee, MD  Patient coming from: [facility] transfer  Chief Complaint: Fall  I have personally briefly reviewed patient's old medical records in [place] [facility]   HPI: Nicholas [name] is a 47 y.o. male with medical history significant of diabetes mellitus type 2 followed by Dr. Elvera Lennox; who presents after accidentally. falling down approximately 15-18 flight of stairs   Patient denies any loss of consciousness.  He bite his tongue during the fall, and ended up landing on his back hitting his head.  He complains of severe pain including neck pain, left shoulder, chest, and back.  Associated symptoms include numbness/tingling sensation in left hand(involving thumb, index, and middle finger) and shortness of breath.  Patient complains of it being difficult to take a inspiratory breath.  He reports chronically having lower extremity numbness that he relates to neuropathy that is unchanged.  Denies any loss of control of bowels/bladder function.    ED Course: Upon admission to the emergency department labs revealed a WBC 15 and glucose 245.  MRI revealed acute traumatic fracture extending through the left C6-7 facet involving the left C7 nerve and superior articular process with focal disruption of the ligamentum flavum posterior around the ligament and adjacent interspinous ligaments.  This was considered an unstable injury and neurosurgery was consulted.  Recommending continuing C collar and keeping the patient n.p.o. for possible need of surgery in a.m.  Review of Systems  Constitutional: Negative for chills, fever and weight loss.  HENT: Negative for ear discharge and nosebleeds.   Eyes: Negative for double vision and pain.  Respiratory: Positive for shortness of breath. Negative for cough.   Cardiovascular: Negative for chest pain  and leg swelling.  Gastrointestinal: Negative for abdominal pain, nausea and vomiting.  Genitourinary: Negative for dysuria and hematuria.  Musculoskeletal: Positive for falls and neck pain.  Skin: Negative for itching and rash.  Neurological: Positive for tingling and sensory change.  Psychiatric/Behavioral: Negative for memory loss and suicidal ideas.    Past Medical History:  Diagnosis Date  . Diabetes mellitus     Past Surgical History:  Procedure Laterality Date  . APPENDECTOMY       reports that he quit smoking about 10 years ago. He has never used smokeless tobacco. He reports that he does not drink alcohol or use drugs.  Allergies  Allergen Reactions  . Penicillins Rash    History reviewed.  Family history significant for diabetes  Prior to Admission medications   Medication Sig Start Date End Date Taking? Authorizing Provider  Colchicine (MITIGARE) 0.6 MG CAPS 1 cap bid for 5-7 days. In the future 2 caps once with acute onset. Patient taking differently: Take 1 capsule by mouth daily as needed (pain).  03/23/17  Yes [place], Betty G, MD  glucose blood [facility] VERIO) test strip Use once daily for glucose control, dx 250.00 07/24/16  Yes Carlus Pavlov, MD  Lancets [facility] ULTRASOFT) lancets Use once daily for glucose control. Dx 250.00 11/14/13  Yes Nicholas Pee, MD  lisinopril (PRINIVIL,ZESTRIL) 5 MG tablet TAKE AS INSTRUCTED BY YOUR PRESCRIBER 11/10/16  Yes Nicholas Pee, MD  MAGNESIUM PO Take 1 capsule by mouth 2 (two) times daily.   Yes [provider]  metFORMIN (GLUCOPHAGE-XR) 500 MG 24 hr tablet TAKE 4 TABLETS (2000 MG) WITH DINNER 12/07/16  Yes Carlus Pavlov, MD  Multiple  Vitamins-Minerals (MULTIVITAMIN ADULTS) TABS Take 1 tablet by mouth at bedtime.   Yes [provider]  simvastatin (ZOCOR) 10 MG tablet TAKE 1 TABLET AT BEDTIME 11/10/16  Yes Nicholas Pee, MD  sitaGLIPtin (JANUVIA) 100 MG tablet Take 1 tablet (100 mg total) by mouth  daily. 07/24/16  Yes Carlus Pavlov, MD  traMADol Janean Sark) 50 MG tablet 1/2-1 tablet twice daily as needed for severe shoulder pain 12/19/14  Yes Nicholas Pee, MD    Physical Exam:  Constitutional: Middle-aged male who appears to be in some moderate discomfort Vitals:   07/02/17 1900 07/02/17 2016 07/03/17 0041 07/03/17 0153  BP: (!) 139/93 (!) 150/101 (!) 143/75 (!) 154/90  Pulse: (!) 110 (!) 108 88 80  Resp: Temp:  97.9 F (36.6 C)    TempSrc:  Oral    SpO2: 95% 96% 96% 96%  Weight:      Height:       Eyes: PERRL, lids and conjunctivae normal ENMT: Mucous membranes are moist. Posterior pharynx clear of any exudate or lesions.Normal dentition.  Neck: Currently in c-collar Respiratory: clear to auscultation bilaterally, no wheezing, no crackles. Normal respiratory effort. No accessory muscle use.  Cardiovascular: Regular rate and rhythm, no murmurs / rubs / gallops. No extremity edema. 2+ pedal pulses. No carotid bruits.  Tenderness to the chest with palpation Abdomen: no tenderness, no masses palpated. No hepatosplenomegaly. Bowel sounds positive.  Musculoskeletal: no clubbing / cyanosis. No joint deformity upper and lower extremities. Good ROM, no contractures. Normal muscle tone.  Skin: Abrasion noted to the patient's head Neurologic: CN 2-12 grossly intact.  Sensation abnormal of the left hand.  Strength appears 5 out of 5 in all extremities. Psychiatric: Normal judgment and insight. Alert and oriented x 3. Normal mood.     Labs on Admission: I have personally reviewed following labs and imaging studies  CBC: Recent Labs  Lab 07/02/17 1652  WBC 15.0*  NEUTROABS 13.8*  HGB 15.6  HCT 43.5  MCV 89.1  PLT 238   Basic Metabolic Panel: Recent Labs  Lab 07/02/17 1652  NA 136  K 4.1  CL 102  CO2 22  GLUCOSE 245*  BUN 16  CREATININE 0.84  CALCIUM 9.1   GFR: Estimated Creatinine Clearance: 140.7 mL/min (by C-G formula based on SCr of 0.84  mg/dL). Liver Function Tests: No results for input(s): AST, ALT, ALKPHOS, BILITOT, PROT, ALBUMIN in the last 168 hours. No results for input(s): LIPASE, AMYLASE in the last 168 hours. No results for input(s): AMMONIA in the last 168 hours. Coagulation Profile: No results for input(s): INR, PROTIME in the last 168 hours. Cardiac Enzymes: Recent Labs  Lab 07/02/17 1652  TROPONINI <0.03   BNP (last 3 results) No results for input(s): PROBNP in the last 8760 hours. HbA1C: No results for input(s): HGBA1C in the last 72 hours. CBG: No results for input(s): GLUCAP in the last 168 hours. Lipid Profile: No results for input(s): CHOL, HDL, LDLCALC, TRIG, CHOLHDL, LDLDIRECT in the last 72 hours. Thyroid Function Tests: No results for input(s): TSH, T4TOTAL, FREET4, T3FREE, THYROIDAB in the last 72 hours. Anemia Panel: No results for input(s): VITAMINB12, FOLATE, FERRITIN, TIBC, IRON, RETICCTPCT in the last 72 hours. Urine analysis:    Component Value Date/Time   BILIRUBINUR n 10/18/2012 1032   PROTEINUR n 10/18/2012 1032   UROBILINOGEN 0.2 10/18/2012 1032   NITRITE n 10/18/2012 1032   LEUKOCYTESUR Negative 10/18/2012 1032   Sepsis Labs: No results  found for this or any previous visit (from the past 240 hour(s)).   Radiological Exams on Admission: Dg Chest 2 View  Result Date: 07/02/2017 CLINICAL DATA:  Larey Seat down steps, now with left shoulder pain, former smoking history EXAM: CHEST - 2 VIEW COMPARISON:  Chest x-ray of 11/02/2007 FINDINGS: The lungs are not optimally aerated. However no pneumonia is seen and there is no evidence of pneumothorax. No pleural effusion is noted. Mediastinal and hilar contours are unremarkable. The heart is within upper limits of normal. No bony abnormality is noted. IMPRESSION: Suboptimal inspiration.  No definite active process. Electronically Signed   By: Dwyane Dee M.D.   On: 07/02/2017 15:43   Ct Head Wo Contrast  Result Date: 07/02/2017 CLINICAL  DATA:  Fall backwards down steps with posterior head injury. Initial encounter. EXAM: CT HEAD WITHOUT CONTRAST CT CERVICAL SPINE WITHOUT CONTRAST TECHNIQUE: Multidetector CT imaging of the head and cervical spine was performed following the standard protocol without intravenous contrast. Multiplanar CT image reconstructions of the cervical spine were also generated. COMPARISON:  None. FINDINGS: CT HEAD FINDINGS Brain: No evidence of acute infarction, hemorrhage, hydrocephalus, extra-axial collection or mass lesion/mass effect. Vascular: No hyperdense vessel or unexpected calcification. Skull: Normal. Negative for fracture or focal lesion. Sinuses/Orbits: No acute finding. Other: Tiny fleck of density along the posterior left scalp near the vertex may represent a small foreign body on, or just within, the superficial scalp soft tissues. CT CERVICAL SPINE FINDINGS Alignment: The cervical spine demonstrates normal alignment without subluxation. Skull base and vertebrae: No evidence of acute fracture or focal bony lesions. Soft tissues and spinal canal: No soft tissue swelling identified. Disc levels: Moderate degenerative disc disease present at C5-6 and C6-7. Upper chest: Negative. IMPRESSION: 1. No evidence of acute brain injury or skull fracture. There is a tiny density along the posterior left scalp near the vertex which may represent a small foreign body on, or within, the superficial scalp. 2. No evidence of acute cervical injury. Moderate degenerative disc disease is present at C5-6 and C6-7. Electronically Signed   By: Irish Lack M.D.   On: 07/02/2017 16:00   Ct Chest W Contrast  Result Date: 07/02/2017 CLINICAL DATA:  Larey Seat back worse today complaining of left shoulder and neck pain and mid sternal pain. EXAM: CT CHEST WITH CONTRAST TECHNIQUE: Multidetector CT imaging of the chest was performed during intravenous contrast administration. CONTRAST:  80mL ISOVUE-300 IOPAMIDOL (ISOVUE-300) INJECTION 61%  COMPARISON:  Current chest radiograph FINDINGS: Cardiovascular: Heart normal in size and configuration. No pericardial effusion. No significant coronary artery calcifications. Great vessels are normal in caliber. No aortic dissection or atherosclerosis. Mediastinum/Nodes: No enlarged mediastinal, hilar, or axillary lymph nodes. Thyroid gland, trachea, and esophagus demonstrate no significant findings. Lungs/Pleura: Mild centrilobular emphysema. Mild scarring noted the lung apices. There are mild dependent reticular opacities consistent with subsegmental atelectasis. No evidence of pneumonia or pulmonary edema. No lung mass or suspicious nodule. No pleural effusion or pneumothorax. Upper Abdomen: No acute abnormality. Musculoskeletal: No fractures.  No bone lesions. IMPRESSION: 1. No acute findings in the chest. 2. No fractures. 3. Mild centrilobular emphysema. Minor areas of lung scarring and dependent subsegmental atelectasis. Emphysema (ICD10-J43.9). Electronically Signed   By: Amie Portland M.D.   On: 07/02/2017 18:05   Ct Cervical Spine Wo Contrast  Result Date: 07/02/2017 CLINICAL DATA:  Fall backwards down steps with posterior head injury. Initial encounter. EXAM: CT HEAD WITHOUT CONTRAST CT CERVICAL SPINE WITHOUT CONTRAST TECHNIQUE: Multidetector CT imaging of  the head and cervical spine was performed following the standard protocol without intravenous contrast. Multiplanar CT image reconstructions of the cervical spine were also generated. COMPARISON:  None. FINDINGS: CT HEAD FINDINGS Brain: No evidence of acute infarction, hemorrhage, hydrocephalus, extra-axial collection or mass lesion/mass effect. Vascular: No hyperdense vessel or unexpected calcification. Skull: Normal. Negative for fracture or focal lesion. Sinuses/Orbits: No acute finding. Other: Tiny fleck of density along the posterior left scalp near the vertex may represent a small foreign body on, or just within, the superficial scalp soft  tissues. CT CERVICAL SPINE FINDINGS Alignment: The cervical spine demonstrates normal alignment without subluxation. Skull base and vertebrae: No evidence of acute fracture or focal bony lesions. Soft tissues and spinal canal: No soft tissue swelling identified. Disc levels: Moderate degenerative disc disease present at C5-6 and C6-7. Upper chest: Negative. IMPRESSION: 1. No evidence of acute brain injury or skull fracture. There is a tiny density along the posterior left scalp near the vertex which may represent a small foreign body on, or within, the superficial scalp. 2. No evidence of acute cervical injury. Moderate degenerative disc disease is present at C5-6 and C6-7. Electronically Signed   By: Irish Lack M.D.   On: 07/02/2017 16:00   Mr Cervical Spine Wo Contrast  Result Date: 07/03/2017 CLINICAL DATA:  Initial evaluation for acute traumatic injury, fall. Ligamentous injury suspected. EXAM: MRI CERVICAL SPINE WITHOUT CONTRAST TECHNIQUE: Multiplanar, multisequence MR imaging of the cervical spine was performed. No intravenous contrast was administered. COMPARISON:  Prior CT from earlier the same day as well as previous MRI from 01/17/2015. FINDINGS: Alignment: Straightening of the normal cervical lordosis. 3 mm anterolisthesis of C6 on C7 with slight focal kyphotic angulation at this level. This finding is new from previous, and most likely acute and traumatic in nature. Vertebrae: Abnormal linear edema extends through the left C6-7 facet, involving the left superior articular process of C7, consistent with acute fracture (series 7001, image 13). The contralateral right C6-7 facet is asymmetrically widened and nearly perched. Probable focal disruption of the adjacent ligamentum flavum. Abnormal linear density seen extending through the posterior aspect of the C6-7 intervertebral disc, suspicious for acute disc injury/rupture (series 7001, image 7). Adjacent posterior longitudinal ligament is likely  disrupted. Abnormal prevertebral edema at this region without frank disruption of the anterior longitudinal ligament. Vertebral body heights are maintained without evidence for fracture. Underlying bone marrow signal intensity normal. Cord: Signal intensity within the cervical spinal cord is normal. No imaging findings to suggest acute traumatic cord injury. No frank epidural hematoma. Posterior Fossa, vertebral arteries, paraspinal tissues: Visualized brain and posterior fossa within normal limits. Craniocervical junction normal. Extensive abnormal soft tissue edema throughout the posterior paraspinous musculature, left greater than right. Edema within the interspinous ligaments of C5-6 and C6-7 likely reflects ligamentous injury and/or disruption. Mild prevertebral edema at the C6-7 level. Normal intravascular flow voids within the vertebral arteries are preserved bilaterally. Disc levels: C2-C3: Unremarkable. C3-C4: Diffuse disc bulge with bilateral uncovertebral spurring, right greater than left. Bulging disc flattens and partially effaces the ventral CSF and resultant mild spinal stenosis. Moderate bilateral C4 foraminal narrowing, right worse than left. Appearance is slightly progressed from previous. C4-C5: Mild disc bulge. No significant canal or foraminal stenosis. C5-C6: Chronic diffuse degenerative disc osteophyte with intervertebral disc space narrowing and bilateral uncovertebral spurring. Broad posterior component flattens the ventral CSF. Moderate spinal stenosis. Secondary mild cord flattening without cord signal changes. Severe left with moderate right C6 foraminal stenosis. C6-C7:  Trace 3 mm anterolisthesis with slight kyphotic angulation, new from previous. Linear T2 hyperintensity through the posterior aspect of the C6-7 interspace, likely reflecting acute disc injury/herniation. Central disc protrusion with 11 mm superior migration, new/increased from previous. Resultant moderate spinal stenosis  with mild flattening of the ventral thecal sac, worsened from previous. Thecal sac measures 7 mm in AP diameter. Severe left with moderate right C7 foraminal stenosis, likely slightly worsened from previous. C7-T1:  Mild facet hypertrophy.  No stenosis. Visualized upper thoracic spine within normal limits. IMPRESSION: 1. Acute traumatic fracture extending through the left C6-7 facet, involving the left C7 superior articular process. Secondary mild posterior displacement of the contralateral right C6-7 facet with 3 mm anterolisthesis of C6 on C7 and trace kyphotic angulation. Focal disruption of the ligamentum flavum, posterior longitudinal ligament, and likely adjacent interspinous ligaments. This is an unstable injury. 2. Abnormal signal intensity through the posterior aspect of the C6-7 interspace, likely traumatic disc injury/protrusion, with superior migration of disc material. Worsened moderate spinal stenosis at this level. 3. No evidence for cord contusion or injury. 4. Extensive soft tissue edema throughout the posterior paraspinous musculature, left greater than right, consistent with injury/contusion. 5. Additional multilevel cervical spondylolysis as above, mildly progressed relative to 2016. Critical Value/emergent results were called by telephone at the time of interpretation on 07/03/2017 at 12:52 am to Dr. Virgina Norfolk , who verbally acknowledged these results. Electronically Signed   By: Rise Mu M.D.   On: 07/03/2017 01:08   Dg Shoulder Left  Result Date: 07/02/2017 CLINICAL DATA:  Left shoulder pain after falling down steps today. EXAM: LEFT SHOULDER - 2+ VIEW COMPARISON:  None. FINDINGS: There is no evidence of fracture or dislocation. There is no evidence of arthropathy or other focal bone abnormality. Soft tissues are unremarkable. IMPRESSION: Negative. Electronically Signed   By: Francene Boyers M.D.   On: 07/02/2017 15:43    EKG: Independently reviewed.  Sinus tachycardia and  117 bpm  Assessment/Plan Acute cervical spine fracture 2/2 fall downstairs: As seen on MRI.   Dr. Lovell Sheehan of neurosurgery was consulted and recommended keeping patient n.p.o. after midnight for possible need of surgery. - admit to a telemetry bed -  n.p.o. after midnight - Continue cervical collar - Neurochecks   - Dilaudid IV prn pain - Neurosurgery to see in a in a.m.  Leukocytosis: Acute.  WBC elevated at 15.  Likely associated with acute fracture. - Continue to monitor  Diabetes mellitus type 2 with peripheral neuropathy: Currently being treated with only oral medications - Hypoglycemic protocols - Hold metformin and Januvia - CBGs every 4 hours w/ sensitive SSI  Essential hypertension - Continue lisinopril  DVT prophylaxis:lovenox   Code Status: Full  Family Communication: Discussed plan of care with the patient family present bedside Disposition Plan: TBD  Consults called: Neurosurgery Admission status: observation  Clydie Braun MD [facility] Pager [phone]   If 7PM-7AM, please contact night-coverage [email or web address] Password [email or web address]  07/03/2017, 2:02 AM

## 2017-07-03 NOTE — Consult Note (Signed)
Reason for Consult: C5-6 and C6-7 fracture, subluxation, herniated disc, stenosis, cervical radiculopathy Referring Physician: Dr. [name] is an 47 y.o. male.  HPI: The patient is a 47 year old diabetic white male whose had problems with her neck in the past.  He has seen Nicholas [name] in 2016 and was worked up with a cervical MRI which demonstrated degenerative changes and narrowing at C5-6 and C6-7.  At that time he was treated with medications and physical therapy and his symptoms improved.  The patient took a fall down some steps yesterday morning.  He has severe pain.  He was seen at the [facility] and worked up with a chest CT cervical CT, etc.  Was diagnosed with a C6-7 facet fracture and subluxation.  He was transferred to mostly on hospital where he got a cervical MRI.  This demonstrated findings as above with significant narrowing at C5-6 and C6-7 as well was ligamentous injury.  The patient was admitted by Nicholas [name] for observation and a neurosurgical consultation was requested.  Presently the patient is accompanied by his wife, son and mother-in-law.  He complains of neck pain with pain into his left shoulder and down his left arm with numbness and tingling and what sounds like the C6 and/or C7 distribution.  He had more diffuse numbness initially but this has improved.  Past Medical History:  Diagnosis Date  . Diabetes mellitus   . Essential hypertension 07/03/2017    Past Surgical History:  Procedure Laterality Date  . APPENDECTOMY      History reviewed. No pertinent family history.  Social History:  reports that he quit smoking about 10 years ago. He has never used smokeless tobacco. He reports that he does not drink alcohol or use drugs.  Allergies:  Allergies  Allergen Reactions  . Penicillins Rash    Medications:  I have reviewed the patient's current medications. Prior to Admission:  (Not in a hospital admission) Scheduled: . enoxaparin  (LOVENOX) injection  40 mg Subcutaneous Daily  . insulin aspart  0-9 Units Subcutaneous Q4H  . lisinopril  5 mg Oral Daily  . simvastatin  10 mg Oral QHS   Continuous: . sodium chloride 75 mL/hr at 07/03/17 0502   VOJ:[id number] (DILAUDID) injection, ipratropium-albuterol Anti-infectives (From admission, onward)   None       Results for orders placed or performed during the hospital encounter of 07/02/17 (from the past 48 hour(s))  Basic metabolic panel     Status: Abnormal   Collection Time: 07/02/17  4:52 PM  Result Value Ref Range   Sodium 136 135 - 145 mmol/L   Potassium 4.1 3.5 - 5.1 mmol/L   Chloride 102 101 - 111 mmol/L   CO2 22 22 - 32 mmol/L   Glucose, Bld 245 (H) 65 - 99 mg/dL   BUN 16 6 - 20 mg/dL   Creatinine, Ser 0.84 0.61 - 1.24 mg/dL   Calcium 9.1 8.9 - 10.3 mg/dL   GFR calc non Af Amer >60 >60 mL/min   GFR calc Af Amer >60 >60 mL/min    Comment: (NOTE) The eGFR has been calculated using the CKD EPI equation. This calculation has not been validated in all clinical situations. eGFR's persistently <60 mL/min signify possible Chronic Kidney Disease.    Anion gap 12 5 - 15    Comment: Performed at [facility], [address] [place], [place] [address]  CBC with Differential     Status: Abnormal  Collection Time: 07/02/17  4:52 PM  Result Value Ref Range   WBC 15.0 (H) 4.0 - 10.5 K/uL   RBC 4.88 4.22 - 5.81 MIL/uL   Hemoglobin 15.6 13.0 - 17.0 g/dL   HCT 43.5 39.0 - 52.0 %   MCV 89.1 78.0 - 100.0 fL   MCH 32.0 26.0 - 34.0 pg   MCHC 35.9 30.0 - 36.0 g/dL   RDW 12.8 11.5 - 15.5 %   Platelets 238 150 - 400 K/uL   Neutrophils Relative % 92 %   Neutro Abs 13.8 (H) 1.7 - 7.7 K/uL   Lymphocytes Relative 5 %   Lymphs Abs 0.8 0.7 - 4.0 K/uL   Monocytes Relative 3 %   Monocytes Absolute 0.5 0.1 - 1.0 K/uL   Eosinophils Relative 0 %   Eosinophils Absolute 0.0 0.0 - 0.7 K/uL   Basophils Relative 0 %   Basophils Absolute 0.0 0.0 - 0.1 K/uL     Comment: Performed at [facility], [address] [place], [place] [address]  Troponin I     Status: None   Collection Time: 07/02/17  4:52 PM  Result Value Ref Range   Troponin I <0.03 <0.03 ng/mL    Comment: Performed at [facility], [address] [place], [place] [address]  CBG monitoring, ED     Status: Abnormal   Collection Time: 07/03/17  4:18 AM  Result Value Ref Range   Glucose-Capillary 236 (H) 65 - 99 mg/dL   Comment 1 Notify RN   CBC     Status: Abnormal   Collection Time: 07/03/17  5:06 AM  Result Value Ref Range   WBC 12.2 (H) 4.0 - 10.5 K/uL   RBC 4.88 4.22 - 5.81 MIL/uL   Hemoglobin 15.4 13.0 - 17.0 g/dL   HCT 45.2 39.0 - 52.0 %   MCV 92.6 78.0 - 100.0 fL   MCH 31.6 26.0 - 34.0 pg   MCHC 34.1 30.0 - 36.0 g/dL   RDW 13.3 11.5 - 15.5 %   Platelets 264 150 - 400 K/uL    Comment: Performed at [facility], [place] [place], [place] [address]  Protime-INR     Status: None   Collection Time: 07/03/17  5:06 AM  Result Value Ref Range   Prothrombin Time 14.0 11.4 - 15.2 seconds   INR 1.09     Comment: Performed at [facility] [address], [place], [place] [address]  APTT     Status: None   Collection Time: 07/03/17  5:06 AM  Result Value Ref Range   aPTT 26 24 - 36 seconds    Comment: Performed at [facility] [address], [place], [place] [address]  Basic metabolic panel     Status: Abnormal   Collection Time: 07/03/17  5:06 AM  Result Value Ref Range   Sodium 135 135 - 145 mmol/L   Potassium 4.1 3.5 - 5.1 mmol/L   Chloride 98 (L) 101 - 111 mmol/L   CO2 25 22 - 32 mmol/L   Glucose, Bld 230 (H) 65 - 99 mg/dL   BUN 12 6 - 20 mg/dL   Creatinine, Ser 0.76 0.61 - 1.24 mg/dL   Calcium 9.1 8.9 - 10.3 mg/dL   GFR calc non Af Amer >60 >60 mL/min   GFR calc Af Amer >60 >60 mL/min    Comment: (NOTE) The eGFR has been calculated using the CKD EPI equation.  This calculation has not been validated in all clinical  situations. eGFR's persistently <60 mL/min signify possible Chronic Kidney Disease.    Anion gap 12 5 - 15    Comment: Performed at [facility] [address], [place], [place] [address]  Magnesium     Status: None   Collection Time: 07/03/17  5:06 AM  Result Value Ref Range   Magnesium 2.1 1.7 - 2.4 mg/dL    Comment: Performed at [facility] [address] [place], [place] [address]    Dg Chest 2 View  Result Date: 07/02/2017 CLINICAL DATA:  [place] [address] down steps, now with left shoulder pain, former smoking history EXAM: CHEST - 2 VIEW COMPARISON:  Chest x-ray of 11/02/2007 FINDINGS: The lungs are not optimally aerated. However no pneumonia is seen and there is no evidence of pneumothorax. No pleural effusion is noted. Mediastinal and hilar contours are unremarkable. The heart is within upper limits of normal. No bony abnormality is noted. IMPRESSION: Suboptimal inspiration.  No definite active process. Electronically Signed   By: Nicholas [name] M.D.   On: 07/02/2017 15:43   Ct Head Wo Contrast  Result Date: 07/02/2017 CLINICAL DATA:  Fall backwards down steps with posterior head injury. Initial encounter. EXAM: CT HEAD WITHOUT CONTRAST CT CERVICAL SPINE WITHOUT CONTRAST TECHNIQUE: Multidetector CT imaging of the head and cervical spine was performed following the standard protocol without intravenous contrast. Multiplanar CT image reconstructions of the cervical spine were also generated. COMPARISON:  None. FINDINGS: CT HEAD FINDINGS Brain: No evidence of acute infarction, hemorrhage, hydrocephalus, extra-axial collection or mass lesion/mass effect. Vascular: No hyperdense vessel or unexpected calcification. Skull: Normal. Negative for fracture or focal lesion. Sinuses/Orbits: No acute finding. Other: Tiny fleck of density along the posterior left scalp near the vertex may represent a small foreign body on, or just within, the superficial scalp soft tissues. CT CERVICAL SPINE FINDINGS  Alignment: The cervical spine demonstrates normal alignment without subluxation. Skull base and vertebrae: No evidence of acute fracture or focal bony lesions. Soft tissues and spinal canal: No soft tissue swelling identified. Disc levels: Moderate degenerative disc disease present at C5-6 and C6-7. Upper chest: Negative. IMPRESSION: 1. No evidence of acute brain injury or skull fracture. There is a tiny density along the posterior left scalp near the vertex which may represent a small foreign body on, or within, the superficial scalp. 2. No evidence of acute cervical injury. Moderate degenerative disc disease is present at C5-6 and C6-7. Electronically Signed   By: Nicholas [name] M.D.   On: 07/02/2017 16:00   Ct Chest W Contrast  Result Date: 07/02/2017 CLINICAL DATA:  [address] back worse today complaining of left shoulder and neck pain and mid sternal pain. EXAM: CT CHEST WITH CONTRAST TECHNIQUE: Multidetector CT imaging of the chest was performed during intravenous contrast administration. CONTRAST:  1m ISOVUE-300 IOPAMIDOL (ISOVUE-300) INJECTION 61% COMPARISON:  Current chest radiograph FINDINGS: Cardiovascular: Heart normal in size and configuration. No pericardial effusion. No significant coronary artery calcifications. Great vessels are normal in caliber. No aortic dissection or atherosclerosis. Mediastinum/Nodes: No enlarged mediastinal, hilar, or axillary lymph nodes. Thyroid gland, trachea, and esophagus demonstrate no significant findings. Lungs/Pleura: Mild centrilobular emphysema. Mild scarring noted the lung apices. There are mild dependent reticular opacities consistent with subsegmental atelectasis. No evidence of pneumonia or pulmonary edema. No lung mass or suspicious nodule. No pleural effusion or pneumothorax. Upper Abdomen: No acute abnormality. Musculoskeletal: No fractures.  No bone lesions. IMPRESSION: 1. No  acute findings in the chest. 2. No fractures. 3. Mild centrilobular emphysema.  Minor areas of lung scarring and dependent subsegmental atelectasis. Emphysema (ICD10-J43.9). Electronically Signed   By: Nicholas [name] M.D.   On: 07/02/2017 18:05   Ct Cervical Spine Wo Contrast  Result Date: 07/02/2017 CLINICAL DATA:  Fall backwards down steps with posterior head injury. Initial encounter. EXAM: CT HEAD WITHOUT CONTRAST CT CERVICAL SPINE WITHOUT CONTRAST TECHNIQUE: Multidetector CT imaging of the head and cervical spine was performed following the standard protocol without intravenous contrast. Multiplanar CT image reconstructions of the cervical spine were also generated. COMPARISON:  None. FINDINGS: CT HEAD FINDINGS Brain: No evidence of acute infarction, hemorrhage, hydrocephalus, extra-axial collection or mass lesion/mass effect. Vascular: No hyperdense vessel or unexpected calcification. Skull: Normal. Negative for fracture or focal lesion. Sinuses/Orbits: No acute finding. Other: Tiny fleck of density along the posterior left scalp near the vertex may represent a small foreign body on, or just within, the superficial scalp soft tissues. CT CERVICAL SPINE FINDINGS Alignment: The cervical spine demonstrates normal alignment without subluxation. Skull base and vertebrae: No evidence of acute fracture or focal bony lesions. Soft tissues and spinal canal: No soft tissue swelling identified. Disc levels: Moderate degenerative disc disease present at C5-6 and C6-7. Upper chest: Negative. IMPRESSION: 1. No evidence of acute brain injury or skull fracture. There is a tiny density along the posterior left scalp near the vertex which may represent a small foreign body on, or within, the superficial scalp. 2. No evidence of acute cervical injury. Moderate degenerative disc disease is present at C5-6 and C6-7. Electronically Signed   By: Nicholas [name] M.D.   On: 07/02/2017 16:00   Mr Cervical Spine Wo Contrast  Result Date: 07/03/2017 CLINICAL DATA:  Initial evaluation for acute traumatic  injury, fall. Ligamentous injury suspected. EXAM: MRI CERVICAL SPINE WITHOUT CONTRAST TECHNIQUE: Multiplanar, multisequence MR imaging of the cervical spine was performed. No intravenous contrast was administered. COMPARISON:  Prior CT from earlier the same day as well as previous MRI from 01/17/2015. FINDINGS: Alignment: Straightening of the normal cervical lordosis. 3 mm anterolisthesis of C6 on C7 with slight focal kyphotic angulation at this level. This finding is new from previous, and most likely acute and traumatic in nature. Vertebrae: Abnormal linear edema extends through the left C6-7 facet, involving the left superior articular process of C7, consistent with acute fracture (series 7001, image 13). The contralateral right C6-7 facet is asymmetrically widened and nearly perched. Probable focal disruption of the adjacent ligamentum flavum. Abnormal linear density seen extending through the posterior aspect of the C6-7 intervertebral disc, suspicious for acute disc injury/rupture (series 7001, image 7). Adjacent posterior longitudinal ligament is likely disrupted. Abnormal prevertebral edema at this region without frank disruption of the anterior longitudinal ligament. Vertebral body heights are maintained without evidence for fracture. Underlying bone marrow signal intensity normal. Cord: Signal intensity within the cervical spinal cord is normal. No imaging findings to suggest acute traumatic cord injury. No frank epidural hematoma. Posterior Fossa, vertebral arteries, paraspinal tissues: Visualized brain and posterior fossa within normal limits. Craniocervical junction normal. Extensive abnormal soft tissue edema throughout the posterior paraspinous musculature, left greater than right. Edema within the interspinous ligaments of C5-6 and C6-7 likely reflects ligamentous injury and/or disruption. Mild prevertebral edema at the C6-7 level. Normal intravascular flow voids within the vertebral arteries are  preserved bilaterally. Disc levels: C2-C3: Unremarkable. C3-C4: Diffuse disc bulge with bilateral uncovertebral spurring, right greater than left. Bulging disc flattens and  partially effaces the ventral CSF and resultant mild spinal stenosis. Moderate bilateral C4 foraminal narrowing, right worse than left. Appearance is slightly progressed from previous. C4-C5: Mild disc bulge. No significant canal or foraminal stenosis. C5-C6: Chronic diffuse degenerative disc osteophyte with intervertebral disc space narrowing and bilateral uncovertebral spurring. Broad posterior component flattens the ventral CSF. Moderate spinal stenosis. Secondary mild cord flattening without cord signal changes. Severe left with moderate right C6 foraminal stenosis. C6-C7: Trace 3 mm anterolisthesis with slight kyphotic angulation, new from previous. Linear T2 hyperintensity through the posterior aspect of the C6-7 interspace, likely reflecting acute disc injury/herniation. Central disc protrusion with 11 mm superior migration, new/increased from previous. Resultant moderate spinal stenosis with mild flattening of the ventral thecal sac, worsened from previous. Thecal sac measures 7 mm in AP diameter. Severe left with moderate right C7 foraminal stenosis, likely slightly worsened from previous. C7-T1:  Mild facet hypertrophy.  No stenosis. Visualized upper thoracic spine within normal limits. IMPRESSION: 1. Acute traumatic fracture extending through the left C6-7 facet, involving the left C7 superior articular process. Secondary mild posterior displacement of the contralateral right C6-7 facet with 3 mm anterolisthesis of C6 on C7 and trace kyphotic angulation. Focal disruption of the ligamentum flavum, posterior longitudinal ligament, and likely adjacent interspinous ligaments. This is an unstable injury. 2. Abnormal signal intensity through the posterior aspect of the C6-7 interspace, likely traumatic disc injury/protrusion, with superior  migration of disc material. Worsened moderate spinal stenosis at this level. 3. No evidence for cord contusion or injury. 4. Extensive soft tissue edema throughout the posterior paraspinous musculature, left greater than right, consistent with injury/contusion. 5. Additional multilevel cervical spondylolysis as above, mildly progressed relative to 2016. Critical Value/emergent results were called by telephone at the time of interpretation on 07/03/2017 at 12:52 am to Nicholas [name] , who verbally acknowledged these results. Electronically Signed   By: Nicholas [name] M.D.   On: 07/03/2017 01:08   Dg Shoulder Left  Result Date: 07/02/2017 CLINICAL DATA:  Left shoulder pain after falling down steps today. EXAM: LEFT SHOULDER - 2+ VIEW COMPARISON:  None. FINDINGS: There is no evidence of fracture or dislocation. There is no evidence of arthropathy or other focal bone abnormality. Soft tissues are unremarkable. IMPRESSION: Negative. Electronically Signed   By: Nicholas [name] M.D.   On: 07/02/2017 15:43    ROS: As above, he denies chest pain, back pain abdominal pain, etc. Blood pressure (!) 152/82, pulse 88, temperature 97.9 F (36.6 C), temperature source Oral, resp. rate 10, height '5\' 11"'$  (1.803 m), weight 115.7 kg (255 lb), SpO2 92 %. Estimated body mass index is 35.57 kg/m as calculated from the following:   Height as of this encounter: '5\' 11"'$  (1.803 m).   Weight as of this encounter: 115.7 kg (255 lb).  Physical Exam  General: An obese pleasant 47 year old white male in obvious distress wearing a cervical collar holding his right arm over his head.  HEENT: Normocephalic, his pupils are equal round reactive light, extraocular muscles intact.  There is no CSF otorrhea, rhinorrhea, battle signs, raccoon's eyes, etc.  Neck: The patient is wearing an Aspen collar.  Thorax: Symmetric  Abdomen: Obese and soft  Extremities: Unremarkable  Neurologic exam: The patient is alert and oriented  x3.  Renal nerves II through XII are examined bilaterally and grossly normal.  His motor strength is 5/5 in his bilateral deltoid, bicep, quadriceps, gastroc anemias, dorsiflexors, right hand grip and tricep.  He has weakness of his  left hand grip and tricep at 4+/5.  Sensory exam demonstrates numbness and tingling in the left C6 and/or C7 distribution.  Cerebellar function is intact and rapid alternating movements of the upper extremities bilaterally.  Imaging studies I have reviewed the patient's cervical CT performed at [facility] yesterday.  He has degenerative changes at C5-6 and C6-7.  Has a left fracture facet at C6-7 with angulation.  I have also reviewed the patient's cervical MRI performed at [facility] today.  It demonstrates the patient has spondylosis and stenosis at C5-6, particularly on the left.  He has a herniated disc at C6-7 with spinal stenosis.  He has posterior ligamentous instability and possible disruption of the anterior posterior longitudinal ligament.  Assessment/Plan: C5-6 and C6-7 discs, fracture, subluxation, stenosis, cervicalgia, cervical radiculopathy, cervical myelopathy: I have discussed the situation with the patient and his wife.  We have discussed the various treatment options.  I recommended a C5-6 and C6-7 anterior cervical discectomy, fusion and plating to decompress his spinal cord and nerves and to address his instability.  I have described that surgery to them.  We have discussed the risks of surgery including risk of anesthesia, hemorrhage, infection, injury to the various anterior structures including nerves and the spinal cord, fusion failure, medical risk, family to relieve the pain, dysphagia, voice changes,  etc.  We have also discussed the possibility that he would need additional posterior surgery in the future, although I do not think this is particularly likely.  We have discussed the alternatives including continued medical management  and wearing the cervical collar.  I have answered all the questions.  He has decided to proceed with surgery.  Nicholas [name] 07/03/2017, 7:59 AM

## 2017-07-03 NOTE — Progress Notes (Signed)
Subjective: The patient is somnolent but easily arousable.  He is in no apparent distress.  He looks well.  Objective: Vital signs in last 24 hours: Temp:  [97.6 F (36.4 C)-98.8 F (37.1 C)] 98.8 F (37.1 C) (04/27 1255) Pulse Rate:  [80-120] 103 (04/27 0830) Resp:  [8-22] 14 (04/27 0830) BP: (122-163)/(69-108) 163/88 (04/27 0830) SpO2:  [90 %-98 %] 93 % (04/27 0830) Weight:  [115.7 kg (255 lb)] 115.7 kg (255 lb) (04/26 1351) Estimated body mass index is 35.57 kg/m as calculated from the following:   Height as of this encounter:  (1.803 m).   Weight as of this encounter: 115.7 kg (255 lb).   Intake/Output from previous day: 04/26 0701 - 04/27 0700 In: 1500 [IV Piggyback:1500] Out: -  Intake/Output this shift: Total I/O In: 1764 [I.V.:1514; IV Piggyback:250] Out: 150 [Blood:150]  Physical exam the patient is somnolent but arousable.  He is moving all 4 extremities well.  The patient's dressing is clean and dry.  There is no hematoma or shift.  Lab Results: Recent Labs    07/02/17 1652 07/03/17 0506  WBC 15.0* 12.2*  HGB 15.6 15.4  HCT 43.5 45.2  PLT 238 264   BMET Recent Labs    07/02/17 1652 07/03/17 0506  NA 136 135  K 4.1 4.1  CL 102 98*  CO2 22 25  GLUCOSE 245* 230*  BUN 16 12  CREATININE 0.84 0.76  CALCIUM 9.1 9.1    Studies/Results: Dg Chest 2 View  Result Date: 07/02/2017 CLINICAL DATA:  Larey Seat down steps, now with left shoulder pain, former smoking history EXAM: CHEST - 2 VIEW COMPARISON:  Chest x-ray of 11/02/2007 FINDINGS: The lungs are not optimally aerated. However no pneumonia is seen and there is no evidence of pneumothorax. No pleural effusion is noted. Mediastinal and hilar contours are unremarkable. The heart is within upper limits of normal. No bony abnormality is noted. IMPRESSION: Suboptimal inspiration.  No definite active process. Electronically Signed   By: Dwyane Dee M.D.   On: 07/02/2017 15:43   Ct Head Wo Contrast  Result  Date: 07/02/2017 CLINICAL DATA:  Fall backwards down steps with posterior head injury. Initial encounter. EXAM: CT HEAD WITHOUT CONTRAST CT CERVICAL SPINE WITHOUT CONTRAST TECHNIQUE: Multidetector CT imaging of the head and cervical spine was performed following the standard protocol without intravenous contrast. Multiplanar CT image reconstructions of the cervical spine were also generated. COMPARISON:  None. FINDINGS: CT HEAD FINDINGS Brain: No evidence of acute infarction, hemorrhage, hydrocephalus, extra-axial collection or mass lesion/mass effect. Vascular: No hyperdense vessel or unexpected calcification. Skull: Normal. Negative for fracture or focal lesion. Sinuses/Orbits: No acute finding. Other: Tiny fleck of density along the posterior left scalp near the vertex may represent a small foreign body on, or just within, the superficial scalp soft tissues. CT CERVICAL SPINE FINDINGS Alignment: The cervical spine demonstrates normal alignment without subluxation. Skull base and vertebrae: No evidence of acute fracture or focal bony lesions. Soft tissues and spinal canal: No soft tissue swelling identified. Disc levels: Moderate degenerative disc disease present at C5-6 and C6-7. Upper chest: Negative. IMPRESSION: 1. No evidence of acute brain injury or skull fracture. There is a tiny density along the posterior left scalp near the vertex which may represent a small foreign body on, or within, the superficial scalp. 2. No evidence of acute cervical injury. Moderate degenerative disc disease is present at C5-6 and C6-7. Electronically Signed   By: Irish Lack M.D.   On:  07/02/2017 16:00   Ct Chest W Contrast  Result Date: 07/02/2017 CLINICAL DATA:  Larey Seat back worse today complaining of left shoulder and neck pain and mid sternal pain. EXAM: CT CHEST WITH CONTRAST TECHNIQUE: Multidetector CT imaging of the chest was performed during intravenous contrast administration. CONTRAST:  80mL ISOVUE-300 IOPAMIDOL  (ISOVUE-300) INJECTION 61% COMPARISON:  Current chest radiograph FINDINGS: Cardiovascular: Heart normal in size and configuration. No pericardial effusion. No significant coronary artery calcifications. Great vessels are normal in caliber. No aortic dissection or atherosclerosis. Mediastinum/Nodes: No enlarged mediastinal, hilar, or axillary lymph nodes. Thyroid gland, trachea, and esophagus demonstrate no significant findings. Lungs/Pleura: Mild centrilobular emphysema. Mild scarring noted the lung apices. There are mild dependent reticular opacities consistent with subsegmental atelectasis. No evidence of pneumonia or pulmonary edema. No lung mass or suspicious nodule. No pleural effusion or pneumothorax. Upper Abdomen: No acute abnormality. Musculoskeletal: No fractures.  No bone lesions. IMPRESSION: 1. No acute findings in the chest. 2. No fractures. 3. Mild centrilobular emphysema. Minor areas of lung scarring and dependent subsegmental atelectasis. Emphysema (ICD10-J43.9). Electronically Signed   By: Amie Portland M.D.   On: 07/02/2017 18:05   Ct Cervical Spine Wo Contrast  Result Date: 07/02/2017 CLINICAL DATA:  Fall backwards down steps with posterior head injury. Initial encounter. EXAM: CT HEAD WITHOUT CONTRAST CT CERVICAL SPINE WITHOUT CONTRAST TECHNIQUE: Multidetector CT imaging of the head and cervical spine was performed following the standard protocol without intravenous contrast. Multiplanar CT image reconstructions of the cervical spine were also generated. COMPARISON:  None. FINDINGS: CT HEAD FINDINGS Brain: No evidence of acute infarction, hemorrhage, hydrocephalus, extra-axial collection or mass lesion/mass effect. Vascular: No hyperdense vessel or unexpected calcification. Skull: Normal. Negative for fracture or focal lesion. Sinuses/Orbits: No acute finding. Other: Tiny fleck of density along the posterior left scalp near the vertex may represent a small foreign body on, or just within, the  superficial scalp soft tissues. CT CERVICAL SPINE FINDINGS Alignment: The cervical spine demonstrates normal alignment without subluxation. Skull base and vertebrae: No evidence of acute fracture or focal bony lesions. Soft tissues and spinal canal: No soft tissue swelling identified. Disc levels: Moderate degenerative disc disease present at C5-6 and C6-7. Upper chest: Negative. IMPRESSION: 1. No evidence of acute brain injury or skull fracture. There is a tiny density along the posterior left scalp near the vertex which may represent a small foreign body on, or within, the superficial scalp. 2. No evidence of acute cervical injury. Moderate degenerative disc disease is present at C5-6 and C6-7. Electronically Signed   By: Irish Lack M.D.   On: 07/02/2017 16:00   Mr Cervical Spine Wo Contrast  Result Date: 07/03/2017 CLINICAL DATA:  Initial evaluation for acute traumatic injury, fall. Ligamentous injury suspected. EXAM: MRI CERVICAL SPINE WITHOUT CONTRAST TECHNIQUE: Multiplanar, multisequence MR imaging of the cervical spine was performed. No intravenous contrast was administered. COMPARISON:  Prior CT from earlier the same day as well as previous MRI from 01/17/2015. FINDINGS: Alignment: Straightening of the normal cervical lordosis. 3 mm anterolisthesis of C6 on C7 with slight focal kyphotic angulation at this level. This finding is new from previous, and most likely acute and traumatic in nature. Vertebrae: Abnormal linear edema extends through the left C6-7 facet, involving the left superior articular process of C7, consistent with acute fracture (series 7001, image 13). The contralateral right C6-7 facet is asymmetrically widened and nearly perched. Probable focal disruption of the adjacent ligamentum flavum. Abnormal linear density seen extending through the posterior  aspect of the C6-7 intervertebral disc, suspicious for acute disc injury/rupture (series 7001, image 7). Adjacent posterior  longitudinal ligament is likely disrupted. Abnormal prevertebral edema at this region without frank disruption of the anterior longitudinal ligament. Vertebral body heights are maintained without evidence for fracture. Underlying bone marrow signal intensity normal. Cord: Signal intensity within the cervical spinal cord is normal. No imaging findings to suggest acute traumatic cord injury. No frank epidural hematoma. Posterior Fossa, vertebral arteries, paraspinal tissues: Visualized brain and posterior fossa within normal limits. Craniocervical junction normal. Extensive abnormal soft tissue edema throughout the posterior paraspinous musculature, left greater than right. Edema within the interspinous ligaments of C5-6 and C6-7 likely reflects ligamentous injury and/or disruption. Mild prevertebral edema at the C6-7 level. Normal intravascular flow voids within the vertebral arteries are preserved bilaterally. Disc levels: C2-C3: Unremarkable. C3-C4: Diffuse disc bulge with bilateral uncovertebral spurring, right greater than left. Bulging disc flattens and partially effaces the ventral CSF and resultant mild spinal stenosis. Moderate bilateral C4 foraminal narrowing, right worse than left. Appearance is slightly progressed from previous. C4-C5: Mild disc bulge. No significant canal or foraminal stenosis. C5-C6: Chronic diffuse degenerative disc osteophyte with intervertebral disc space narrowing and bilateral uncovertebral spurring. Broad posterior component flattens the ventral CSF. Moderate spinal stenosis. Secondary mild cord flattening without cord signal changes. Severe left with moderate right C6 foraminal stenosis. C6-C7: Trace 3 mm anterolisthesis with slight kyphotic angulation, new from previous. Linear T2 hyperintensity through the posterior aspect of the C6-7 interspace, likely reflecting acute disc injury/herniation. Central disc protrusion with 11 mm superior migration, new/increased from previous.  Resultant moderate spinal stenosis with mild flattening of the ventral thecal sac, worsened from previous. Thecal sac measures 7 mm in AP diameter. Severe left with moderate right C7 foraminal stenosis, likely slightly worsened from previous. C7-T1:  Mild facet hypertrophy.  No stenosis. Visualized upper thoracic spine within normal limits. IMPRESSION: 1. Acute traumatic fracture extending through the left C6-7 facet, involving the left C7 superior articular process. Secondary mild posterior displacement of the contralateral right C6-7 facet with 3 mm anterolisthesis of C6 on C7 and trace kyphotic angulation. Focal disruption of the ligamentum flavum, posterior longitudinal ligament, and likely adjacent interspinous ligaments. This is an unstable injury. 2. Abnormal signal intensity through the posterior aspect of the C6-7 interspace, likely traumatic disc injury/protrusion, with superior migration of disc material. Worsened moderate spinal stenosis at this level. 3. No evidence for cord contusion or injury. 4. Extensive soft tissue edema throughout the posterior paraspinous musculature, left greater than right, consistent with injury/contusion. 5. Additional multilevel cervical spondylolysis as above, mildly progressed relative to 2016. Critical Value/emergent results were called by telephone at the time of interpretation on 07/03/2017 at 12:52 am to Dr. Virgina Norfolk , who verbally acknowledged these results. Electronically Signed   By: Rise Mu M.D.   On: 07/03/2017 01:08   Dg Shoulder Left  Result Date: 07/02/2017 CLINICAL DATA:  Left shoulder pain after falling down steps today. EXAM: LEFT SHOULDER - 2+ VIEW COMPARISON:  None. FINDINGS: There is no evidence of fracture or dislocation. There is no evidence of arthropathy or other focal bone abnormality. Soft tissues are unremarkable. IMPRESSION: Negative. Electronically Signed   By: Francene Boyers M.D.   On: 07/02/2017 15:43     Assessment/Plan: The patient is doing well.  I spoke with his family.  LOS: 0 days     Cristi Loron 07/03/2017, 1:12 PM

## 2017-07-03 NOTE — Anesthesia Procedure Notes (Signed)
Procedure Name: Intubation Date/Time: 07/03/2017 10:00 AM Performed by: Shireen Quan, CRNA Pre-anesthesia Checklist: Patient identified, Emergency Drugs available, Suction available and Patient being monitored Patient Re-evaluated:Patient Re-evaluated prior to induction Oxygen Delivery Method: Circle System Utilized Preoxygenation: Pre-oxygenation with 100% oxygen Induction Type: IV induction Ventilation: Mask ventilation without difficulty Laryngoscope Size: Glidescope and 4 Tube type: Oral Tube size: 7.5 mm Number of attempts: 1 Airway Equipment and Method: Rigid stylet and Video-laryngoscopy (Patient left in rigid cervical collar throughout induction and intubation with neck maintained in neutral position) Placement Confirmation: ETT inserted through vocal cords under direct vision,  positive ETCO2 and breath sounds checked- equal and bilateral Secured at: 22 cm Tube secured with: Tape Dental Injury: Teeth and Oropharynx as per pre-operative assessment

## 2017-07-03 NOTE — Anesthesia Postprocedure Evaluation (Signed)
Anesthesia Post Note  Patient: Nicholas [name]  Procedure(s) Performed: ANTERIOR CERVICAL DECOMPRESSION/DISCECTOMY FUSION PLATING 2 C5-6 AND C67 (N/A Neck)     Patient location during evaluation: PACU Anesthesia Type: General Level of consciousness: awake and alert Pain management: pain level controlled Vital Signs Assessment: post-procedure vital signs reviewed and stable Respiratory status: spontaneous breathing, nonlabored ventilation, respiratory function stable and patient connected to nasal cannula oxygen Cardiovascular status: blood pressure returned to baseline and stable Postop Assessment: no apparent nausea or vomiting Anesthetic complications: no    Last Vitals:  Vitals:   07/03/17 1506 07/03/17 1511  BP:  (!) 148/95  Pulse: 100 (!) 104  Resp: 15   Temp:    SpO2: 94% 95%    Last Pain:  Vitals:   07/03/17 1511  TempSrc:   PainSc: 4                  Catheryn Slifer

## 2017-07-03 NOTE — Transfer of Care (Signed)
Immediate Anesthesia Transfer of Care Note  Patient: Nicholas [name]  Procedure(s) Performed: ANTERIOR CERVICAL DECOMPRESSION/DISCECTOMY FUSION PLATING 2 C5-6 AND C67 (N/A Neck)  Patient Location: PACU  Anesthesia Type:General  Level of Consciousness: drowsy and patient cooperative  Airway & Oxygen Therapy: Patient Spontanous Breathing and Patient connected to nasal cannula oxygen  Post-op Assessment: Report given to RN, Post -op Vital signs reviewed and stable and Patient moving all extremities  Post vital signs: Reviewed and stable  Last Vitals:  Vitals Value Taken Time  BP 154/86 07/03/2017 12:55 PM  Temp    Pulse 107 07/03/2017 12:56 PM  Resp 15 07/03/2017 12:56 PM  SpO2 91 % 07/03/2017 12:56 PM  Vitals shown include unvalidated device data.  Last Pain:  Vitals:   07/03/17 0800  TempSrc:   PainSc: 8          Complications: No apparent anesthesia complications

## 2017-07-03 NOTE — Progress Notes (Signed)
Awake /alert oriented x 4/ family in/updated on room status / and off monitor, moved to pacu 16 to sleep

## 2017-07-03 NOTE — Anesthesia Preprocedure Evaluation (Addendum)
Anesthesia Evaluation  Patient identified by MRN, date of birth, ID band Patient awake    Reviewed: Allergy & Precautions, NPO status , Patient's Chart, lab work & pertinent test results, reviewed documented beta blocker date and time   History of Anesthesia Complications Negative for: history of anesthetic complications  Airway Mallampati: II   Neck ROM: Limited  Mouth opening: Limited Mouth Opening Comment: Rigid cervical collar in place Dental  (+) Teeth Intact, Dental Advisory Given   Pulmonary former smoker,    breath sounds clear to auscultation       Cardiovascular hypertension, Pt. on medications  Rhythm:Regular     Neuro/Psych  Neuromuscular disease negative psych ROS   GI/Hepatic negative GI ROS, Neg liver ROS,   Endo/Other  diabetes, Well Controlled, Type 2  Renal/GU      Musculoskeletal   Abdominal   Peds  Hematology   Anesthesia Other Findings   Reproductive/Obstetrics                            Anesthesia Physical Anesthesia Plan  ASA: III  Anesthesia Plan: General   Post-op Pain Management:    Induction:   PONV Risk Score and Plan: 3 and Ondansetron and Dexamethasone  Airway Management Planned: Oral ETT and Video Laryngoscope Planned  Additional Equipment: None  Intra-op Plan:   Post-operative Plan: Extubation in OR  Informed Consent: I have reviewed the patients History and Physical, chart, labs and discussed the procedure including the risks, benefits and alternatives for the proposed anesthesia with the patient or authorized representative who has indicated his/her understanding and acceptance.   Dental advisory given  Plan Discussed with: CRNA, Anesthesiologist and Surgeon  Anesthesia Plan Comments:        Anesthesia Quick Evaluation

## 2017-07-04 DIAGNOSIS — S129XXA Fracture of neck, unspecified, initial encounter: Secondary | ICD-10-CM | POA: Diagnosis not present

## 2017-07-04 LAB — HEMOGLOBIN A1C
HEMOGLOBIN A1C: 6.6 % — AB (ref 4.8–5.6)
Mean Plasma Glucose: 142.72 mg/dL

## 2017-07-04 LAB — GLUCOSE, CAPILLARY
GLUCOSE-CAPILLARY: 224 mg/dL — AB (ref 65–99)
Glucose-Capillary: 251 mg/dL — ABNORMAL HIGH (ref 65–99)
Glucose-Capillary: 373 mg/dL — ABNORMAL HIGH (ref 65–99)

## 2017-07-04 MED ORDER — FAMOTIDINE 20 MG PO TABS: 20.0000 mg | ORAL_TABLET | Freq: Two times a day (BID) | ORAL | Status: DC

## 2017-07-04 MED ORDER — OXYCODONE HCL 5 MG PO TABS: 5.0000 mg | ORAL_TABLET | ORAL | 0 refills | Status: DC | PRN

## 2017-07-04 MED ORDER — CYCLOBENZAPRINE HCL 10 MG PO TABS: 10.0000 mg | ORAL_TABLET | Freq: Three times a day (TID) | ORAL | 1 refills | Status: DC | PRN

## 2017-07-04 NOTE — Evaluation (Signed)
Physical Therapy Evaluation Patient Details Name: Nicholas [name] MRN: [id number] DOB: 05-Mar-1971 Today's Date: 07/04/2017   History of Present Illness  Pt is a 47 y.o. male with PMH significant for DM. He presented to the ED after falling down a flight of stairs. Xray revealed cervical fx. He underwent ACDF 07-03-17.     Clinical Impression  PT eval complete. Pt is independent with all functional mobility. All education complete. Pt to d/c home today with his wife. PT signing off.    Follow Up Recommendations No PT follow up;Supervision - Intermittent(24-hour supervision first 24-48 hours)    Equipment Recommendations  None recommended by PT    Recommendations for Other Services       Precautions / Restrictions Precautions Precautions: Cervical Precaution Comments: Pt educated on cervical precautions. Required Braces or Orthoses: Cervical Brace Cervical Brace: Hard collar;At all times      Mobility  Bed Mobility Overal bed mobility: Independent                Transfers Overall transfer level: Independent Equipment used: None                Ambulation/Gait Ambulation/Gait assistance: Independent Ambulation Distance (Feet): 250 Feet Assistive device: None Gait Pattern/deviations: WFL(Within Functional Limits)   Gait velocity interpretation: >4.37 ft/sec, indicative of normal walking speed General Gait Details: steady gait  Stairs            Wheelchair Mobility    Modified Rankin (Stroke Patients Only)       Balance Overall balance assessment: Independent                                           Pertinent Vitals/Pain Pain Assessment: 0-10 Pain Score: 2  Pain Location: neck Pain Descriptors / Indicators: Discomfort Pain Intervention(s): Monitored during session;Repositioned    Home Living Family/patient expects to be discharged to:: Private residence Living Arrangements: Spouse/significant other;Children Available  Help at Discharge: Family;Available 24 hours/day Type of Home: House Home Access: Level entry     Home Layout: Two level;Able to live on main level with bedroom/bathroom Home Equipment: Shower seat      Prior Function Level of Independence: Independent         Comments: Active. Works full time.     Hand Dominance   Dominant Hand: Right    Extremity/Trunk Assessment   Upper Extremity Assessment Upper Extremity Assessment: LUE deficits/detail LUE Deficits / Details: numbness lateral aspect of hand (radial nerve)    Lower Extremity Assessment Lower Extremity Assessment: Overall WFL for tasks assessed    Cervical / Trunk Assessment Cervical / Trunk Assessment: Normal  Communication   Communication: No difficulties  Cognition Arousal/Alertness: Awake/alert Behavior During Therapy: WFL for tasks assessed/performed Overall Cognitive Status: Within Functional Limits for tasks assessed                                        General Comments      Exercises     Assessment/Plan    PT Assessment Patent does not need any further PT services  PT Problem List         PT Treatment Interventions      PT Goals (Current goals can be found in the Care Plan section)  Acute Rehab PT Goals  Patient Stated Goal: home today PT Goal Formulation: All assessment and education complete, DC therapy    Frequency     Barriers to discharge        Co-evaluation               AM-PAC PT "6 Clicks" Daily Activity  Outcome Measure Difficulty turning over in bed (including adjusting bedclothes, sheets and blankets)?: None Difficulty moving from lying on back to sitting on the side of the bed? : None Difficulty sitting down on and standing up from a chair with arms (e.g., wheelchair, bedside commode, etc,.)?: None Help needed moving to and from a bed to chair (including a wheelchair)?: None Help needed walking in hospital room?: None Help needed climbing 3-5  steps with a railing? : None 6 Click Score: 24    End of Session   Activity Tolerance: Patient tolerated treatment well Patient left: in bed;with call bell/phone within reach;with family/visitor present Nurse Communication: Mobility status PT Visit Diagnosis: Difficulty in walking, not elsewhere classified (R26.2);Pain    Time: 1610-9604 PT Time Calculation (min) (ACUTE ONLY): 19 min   Charges:   PT Evaluation $PT Eval Moderate Complexity: 1 Mod     PT G Codes:        Aida Raider, PT  Office # [phone] Pager [phone]   Ilda Foil 07/04/2017, 11:55 AM

## 2017-07-04 NOTE — Progress Notes (Signed)
PROGRESS NOTE    Nicholas [name]  ZOX:[id number] DOB: 1970-05-21 DOA: 07/02/2017 PCP: Roderick Pee, MD   Brief Narrative:  Nicholas [name] is a 47 y.o. male with medical history significant of diabetes mellitus type 2 followed by Dr. Elvera Lennox; who presented after accidentally falling down approximately 15-18 flight of stairs   Patient denied any loss of consciousness.  He bite his tongue during the fall, and ended up landing on his back hitting his head.  He complains of severe pain including neck pain, left shoulder, chest, and back.  Associated symptoms include numbness/tingling sensation in left hand(involving thumb, index, and middle finger) and shortness of breath. Patient was found to have acute cervical spine fracture secondary to fall.  Neurosurgery operated on him .Plan is to discharge him today.    Assessment & Plan:   Principal Problem:   Cervical vertebral fracture (HCC) Active Problems:   Type 2 diabetes mellitus with peripheral neuropathy (HCC)   Essential hypertension   Leukocytosis   Accidental fall on or from stairs or steps   C7 cervical fracture (HCC)   Acute cervical spine fracture 2/2 fall downstairs: C5-6 and C6-7 herniated disc, spondylosis, fracture, subluxation, spinal stenosis, cervical radiculopathy, cervical myelopathy. Underwent  C5-6 and C6-7 anterior cervical discectomy, fusion and plating on the patient on 07/03/2017 by neurosurgery. He will follow-up with neurosurgery as an outpatient.  Leukocytosis:Improved.  Most likely reactive  Diabetes mellitus type 2 with peripheral neuropathy:Continue home meds  Essential hypertension: BP stable.Continue home meds   Patient evaluated by physical therapy and cleared for discharge.  DVT prophylaxis: Lovenox Code Status: Full Family Communication: None Disposition Plan: DC today   Subjective:  Patient seen and examined the bedside this morning.  Remains comfortable.  Willing to go home.     Objective: Vitals:   07/03/17 2046 07/04/17 0033 07/04/17 0422 07/04/17 0816  BP: (!) 165/82 (!) 152/83 125/65 123/83  Pulse: (!) 129 (!) 114 (!) 103 89  Resp: 20 18 18 20   Temp: 99 F (37.2 C) 98.4 F (36.9 C) 98.4 F (36.9 C) 98 F (36.7 C)  TempSrc: Oral Oral Oral Oral  SpO2: 91% 92% 92% 96%  Weight:      Height:        Intake/Output Summary (Last 24 hours) at 07/04/2017 1218 Last data filed at 07/03/2017 2245 Gross per 24 hour  Intake 2014 ml  Output 425 ml  Net 1589 ml   Filed Weights   07/02/17 1351  Weight: 115.7 kg (255 lb)    Examination:  General exam: Appears calm and comfortable ,Not in distress,average built HEENT:PERRL,Oral mucosa moist, Ear/Nose normal on gross exam,cervical collar Respiratory system: Bilateral equal air entry, normal vesicular breath sounds, no wheezes or crackles  Cardiovascular system: S1 & S2 heard, RRR. No JVD, murmurs, rubs, gallops or clicks. No pedal edema. Gastrointestinal system: Abdomen is nondistended, soft and nontender. No organomegaly or masses felt. Normal bowel sounds heard. Central nervous system: Alert and oriented. No focal neurological deficits. Extremities: No edema, no clubbing ,no cyanosis, distal peripheral pulses palpable. Skin: No rashes, lesions or ulcers,no icterus ,no pallor MSK: Normal muscle bulk,tone ,power Psychiatry: Judgement and insight appear normal. Mood & affect appropriate.     Data Reviewed: I have personally reviewed following labs and imaging studies  CBC: Recent Labs  Lab 07/02/17 1652 07/03/17 0506  WBC 15.0* 12.2*  NEUTROABS 13.8*  --   HGB 15.6 15.4  HCT 43.5 45.2  MCV 89.1 92.6  PLT  238 264   Basic Metabolic Panel: Recent Labs  Lab 07/02/17 1652 07/03/17 0506  NA 136 135  K 4.1 4.1  CL 102 98*  CO2 22 25  GLUCOSE 245* 230*  BUN 16 12  CREATININE 0.84 0.76  CALCIUM 9.1 9.1  MG  --  2.1   GFR: Estimated Creatinine Clearance: 147.7 mL/min (by C-G formula based on SCr  of 0.76 mg/dL). Liver Function Tests: No results for input(s): AST, ALT, ALKPHOS, BILITOT, PROT, ALBUMIN in the last 168 hours. No results for input(s): LIPASE, AMYLASE in the last 168 hours. No results for input(s): AMMONIA in the last 168 hours. Coagulation Profile: Recent Labs  Lab 07/03/17 0506  INR 1.09   Cardiac Enzymes: Recent Labs  Lab 07/02/17 1652  TROPONINI <0.03   BNP (last 3 results) No results for input(s): PROBNP in the last 8760 hours. HbA1C: Recent Labs    07/03/17 0244  HGBA1C 6.6*   CBG: Recent Labs  Lab 07/03/17 1624 07/03/17 2137 07/04/17 0030 07/04/17 0420 07/04/17 0813  GLUCAP 233* 341* 373* 251* 224*   Lipid Profile: No results for input(s): CHOL, HDL, LDLCALC, TRIG, CHOLHDL, LDLDIRECT in the last 72 hours. Thyroid Function Tests: No results for input(s): TSH, T4TOTAL, FREET4, T3FREE, THYROIDAB in the last 72 hours. Anemia Panel: No results for input(s): VITAMINB12, FOLATE, FERRITIN, TIBC, IRON, RETICCTPCT in the last 72 hours. Sepsis Labs: No results for input(s): PROCALCITON, LATICACIDVEN in the last 168 hours.  No results found for this or any previous visit (from the past 240 hour(s)).       Radiology Studies: Dg Chest 2 View  Result Date: 07/02/2017 CLINICAL DATA:  Larey Seat down steps, now with left shoulder pain, former smoking history EXAM: CHEST - 2 VIEW COMPARISON:  Chest x-ray of 11/02/2007 FINDINGS: The lungs are not optimally aerated. However no pneumonia is seen and there is no evidence of pneumothorax. No pleural effusion is noted. Mediastinal and hilar contours are unremarkable. The heart is within upper limits of normal. No bony abnormality is noted. IMPRESSION: Suboptimal inspiration.  No definite active process. Electronically Signed   By: Dwyane Dee M.D.   On: 07/02/2017 15:43   Dg Cervical Spine 2-3 Views  Result Date: 07/03/2017 CLINICAL DATA:  Surgery. EXAM: CERVICAL SPINE - 2-3 VIEW COMPARISON:  None. FINDINGS: Two  views are obtained during surgery. On the first view, the C4-5 disc space level is marked by surgical instrument. Surgical material is seen in this region on the second image. No other acute abnormalities. IMPRESSION: Intraoperative imaging as above. Electronically Signed   By: Gerome Sam III M.D   On: 07/03/2017 17:58   Ct Head Wo Contrast  Result Date: 07/02/2017 CLINICAL DATA:  Fall backwards down steps with posterior head injury. Initial encounter. EXAM: CT HEAD WITHOUT CONTRAST CT CERVICAL SPINE WITHOUT CONTRAST TECHNIQUE: Multidetector CT imaging of the head and cervical spine was performed following the standard protocol without intravenous contrast. Multiplanar CT image reconstructions of the cervical spine were also generated. COMPARISON:  None. FINDINGS: CT HEAD FINDINGS Brain: No evidence of acute infarction, hemorrhage, hydrocephalus, extra-axial collection or mass lesion/mass effect. Vascular: No hyperdense vessel or unexpected calcification. Skull: Normal. Negative for fracture or focal lesion. Sinuses/Orbits: No acute finding. Other: Tiny fleck of density along the posterior left scalp near the vertex may represent a small foreign body on, or just within, the superficial scalp soft tissues. CT CERVICAL SPINE FINDINGS Alignment: The cervical spine demonstrates normal alignment without subluxation. Skull base and  vertebrae: No evidence of acute fracture or focal bony lesions. Soft tissues and spinal canal: No soft tissue swelling identified. Disc levels: Moderate degenerative disc disease present at C5-6 and C6-7. Upper chest: Negative. IMPRESSION: 1. No evidence of acute brain injury or skull fracture. There is a tiny density along the posterior left scalp near the vertex which may represent a small foreign body on, or within, the superficial scalp. 2. No evidence of acute cervical injury. Moderate degenerative disc disease is present at C5-6 and C6-7. Electronically Signed   By: Irish Lack M.D.   On: 07/02/2017 16:00   Ct Chest W Contrast  Result Date: 07/02/2017 CLINICAL DATA:  Larey Seat back worse today complaining of left shoulder and neck pain and mid sternal pain. EXAM: CT CHEST WITH CONTRAST TECHNIQUE: Multidetector CT imaging of the chest was performed during intravenous contrast administration. CONTRAST:  80mL ISOVUE-300 IOPAMIDOL (ISOVUE-300) INJECTION 61% COMPARISON:  Current chest radiograph FINDINGS: Cardiovascular: Heart normal in size and configuration. No pericardial effusion. No significant coronary artery calcifications. Great vessels are normal in caliber. No aortic dissection or atherosclerosis. Mediastinum/Nodes: No enlarged mediastinal, hilar, or axillary lymph nodes. Thyroid gland, trachea, and esophagus demonstrate no significant findings. Lungs/Pleura: Mild centrilobular emphysema. Mild scarring noted the lung apices. There are mild dependent reticular opacities consistent with subsegmental atelectasis. No evidence of pneumonia or pulmonary edema. No lung mass or suspicious nodule. No pleural effusion or pneumothorax. Upper Abdomen: No acute abnormality. Musculoskeletal: No fractures.  No bone lesions. IMPRESSION: 1. No acute findings in the chest. 2. No fractures. 3. Mild centrilobular emphysema. Minor areas of lung scarring and dependent subsegmental atelectasis. Emphysema (ICD10-J43.9). Electronically Signed   By: Amie Portland M.D.   On: 07/02/2017 18:05   Ct Cervical Spine Wo Contrast  Result Date: 07/02/2017 CLINICAL DATA:  Fall backwards down steps with posterior head injury. Initial encounter. EXAM: CT HEAD WITHOUT CONTRAST CT CERVICAL SPINE WITHOUT CONTRAST TECHNIQUE: Multidetector CT imaging of the head and cervical spine was performed following the standard protocol without intravenous contrast. Multiplanar CT image reconstructions of the cervical spine were also generated. COMPARISON:  None. FINDINGS: CT HEAD FINDINGS Brain: No evidence of acute  infarction, hemorrhage, hydrocephalus, extra-axial collection or mass lesion/mass effect. Vascular: No hyperdense vessel or unexpected calcification. Skull: Normal. Negative for fracture or focal lesion. Sinuses/Orbits: No acute finding. Other: Tiny fleck of density along the posterior left scalp near the vertex may represent a small foreign body on, or just within, the superficial scalp soft tissues. CT CERVICAL SPINE FINDINGS Alignment: The cervical spine demonstrates normal alignment without subluxation. Skull base and vertebrae: No evidence of acute fracture or focal bony lesions. Soft tissues and spinal canal: No soft tissue swelling identified. Disc levels: Moderate degenerative disc disease present at C5-6 and C6-7. Upper chest: Negative. IMPRESSION: 1. No evidence of acute brain injury or skull fracture. There is a tiny density along the posterior left scalp near the vertex which may represent a small foreign body on, or within, the superficial scalp. 2. No evidence of acute cervical injury. Moderate degenerative disc disease is present at C5-6 and C6-7. Electronically Signed   By: Irish Lack M.D.   On: 07/02/2017 16:00   Mr Cervical Spine Wo Contrast  Result Date: 07/03/2017 CLINICAL DATA:  Initial evaluation for acute traumatic injury, fall. Ligamentous injury suspected. EXAM: MRI CERVICAL SPINE WITHOUT CONTRAST TECHNIQUE: Multiplanar, multisequence MR imaging of the cervical spine was performed. No intravenous contrast was administered. COMPARISON:  Prior CT from earlier  the same day as well as previous MRI from 01/17/2015. FINDINGS: Alignment: Straightening of the normal cervical lordosis. 3 mm anterolisthesis of C6 on C7 with slight focal kyphotic angulation at this level. This finding is new from previous, and most likely acute and traumatic in nature. Vertebrae: Abnormal linear edema extends through the left C6-7 facet, involving the left superior articular process of C7, consistent with  acute fracture (series 7001, image 13). The contralateral right C6-7 facet is asymmetrically widened and nearly perched. Probable focal disruption of the adjacent ligamentum flavum. Abnormal linear density seen extending through the posterior aspect of the C6-7 intervertebral disc, suspicious for acute disc injury/rupture (series 7001, image 7). Adjacent posterior longitudinal ligament is likely disrupted. Abnormal prevertebral edema at this region without frank disruption of the anterior longitudinal ligament. Vertebral body heights are maintained without evidence for fracture. Underlying bone marrow signal intensity normal. Cord: Signal intensity within the cervical spinal cord is normal. No imaging findings to suggest acute traumatic cord injury. No frank epidural hematoma. Posterior Fossa, vertebral arteries, paraspinal tissues: Visualized brain and posterior fossa within normal limits. Craniocervical junction normal. Extensive abnormal soft tissue edema throughout the posterior paraspinous musculature, left greater than right. Edema within the interspinous ligaments of C5-6 and C6-7 likely reflects ligamentous injury and/or disruption. Mild prevertebral edema at the C6-7 level. Normal intravascular flow voids within the vertebral arteries are preserved bilaterally. Disc levels: C2-C3: Unremarkable. C3-C4: Diffuse disc bulge with bilateral uncovertebral spurring, right greater than left. Bulging disc flattens and partially effaces the ventral CSF and resultant mild spinal stenosis. Moderate bilateral C4 foraminal narrowing, right worse than left. Appearance is slightly progressed from previous. C4-C5: Mild disc bulge. No significant canal or foraminal stenosis. C5-C6: Chronic diffuse degenerative disc osteophyte with intervertebral disc space narrowing and bilateral uncovertebral spurring. Broad posterior component flattens the ventral CSF. Moderate spinal stenosis. Secondary mild cord flattening without cord  signal changes. Severe left with moderate right C6 foraminal stenosis. C6-C7: Trace 3 mm anterolisthesis with slight kyphotic angulation, new from previous. Linear T2 hyperintensity through the posterior aspect of the C6-7 interspace, likely reflecting acute disc injury/herniation. Central disc protrusion with 11 mm superior migration, new/increased from previous. Resultant moderate spinal stenosis with mild flattening of the ventral thecal sac, worsened from previous. Thecal sac measures 7 mm in AP diameter. Severe left with moderate right C7 foraminal stenosis, likely slightly worsened from previous. C7-T1:  Mild facet hypertrophy.  No stenosis. Visualized upper thoracic spine within normal limits. IMPRESSION: 1. Acute traumatic fracture extending through the left C6-7 facet, involving the left C7 superior articular process. Secondary mild posterior displacement of the contralateral right C6-7 facet with 3 mm anterolisthesis of C6 on C7 and trace kyphotic angulation. Focal disruption of the ligamentum flavum, posterior longitudinal ligament, and likely adjacent interspinous ligaments. This is an unstable injury. 2. Abnormal signal intensity through the posterior aspect of the C6-7 interspace, likely traumatic disc injury/protrusion, with superior migration of disc material. Worsened moderate spinal stenosis at this level. 3. No evidence for cord contusion or injury. 4. Extensive soft tissue edema throughout the posterior paraspinous musculature, left greater than right, consistent with injury/contusion. 5. Additional multilevel cervical spondylolysis as above, mildly progressed relative to 2016. Critical Value/emergent results were called by telephone at the time of interpretation on 07/03/2017 at 12:52 am to Dr. Virgina Norfolk , who verbally acknowledged these results. Electronically Signed   By: Rise Mu M.D.   On: 07/03/2017 01:08   Dg Shoulder Left  Result Date:  07/02/2017 CLINICAL DATA:  Left  shoulder pain after falling down steps today. EXAM: LEFT SHOULDER - 2+ VIEW COMPARISON:  None. FINDINGS: There is no evidence of fracture or dislocation. There is no evidence of arthropathy or other focal bone abnormality. Soft tissues are unremarkable. IMPRESSION: Negative. Electronically Signed   By: Francene Boyers M.D.   On: 07/02/2017 15:43        Scheduled Meds: . acetaminophen  1,000 mg Oral Q6H  . docusate sodium  100 mg Oral BID  . famotidine  20 mg Oral BID  . insulin aspart  0-20 Units Subcutaneous Q4H  . metFORMIN  500 mg Oral BID WC  . sodium chloride flush  3 mL Intravenous Q12H   Continuous Infusions:   LOS: 0 days    Time spent: 25 mins.More than 50% of that time was spent in counseling and/or coordination of care.      Burnadette Pop, MD [facility] Pager [phone]  If 7PM-7AM, please contact night-coverage [email or web address] Password [facility] 07/04/2017, 12:18 PM

## 2017-07-04 NOTE — Care Management Note (Signed)
Case Management Note  Patient Details  Name: Nicholas [name] MRN: [id number] Date of Birth: May 07, 1970  Subjective/Objective:                    Action/Plan: Pt discharging home with self care. Pt has PCP, insurance and transportation home.   Expected Discharge Date:  07/04/17               Expected Discharge Plan:  Home/Self Care  In-House Referral:     Discharge planning Services     Post Acute Care Choice:    Choice offered to:     DME Arranged:    DME Agency:     HH Arranged:    HH Agency:     Status of Service:  Completed, signed off  If discussed at [employer] of Stay Meetings, dates discussed:    Additional Comments:  Kermit Balo, RN 07/04/2017, 12:48 PM

## 2017-07-04 NOTE — Plan of Care (Signed)
  Problem: Health Behavior/Discharge Planning: Goal: Ability to manage health-related needs will improve Outcome: Adequate for Discharge   Problem: Clinical Measurements: Goal: Ability to maintain clinical measurements within normal limits will improve Outcome: Adequate for Discharge Goal: Will remain free from infection Outcome: Adequate for Discharge Goal: Diagnostic test results will improve Outcome: Adequate for Discharge Goal: Respiratory complications will improve Outcome: Adequate for Discharge Goal: Cardiovascular complication will be avoided Outcome: Adequate for Discharge   Problem: Activity: Goal: Risk for activity intolerance will decrease Outcome: Adequate for Discharge   Problem: Nutrition: Goal: Adequate nutrition will be maintained Outcome: Adequate for Discharge   Problem: Elimination: Goal: Will not experience complications related to bowel motility Outcome: Adequate for Discharge Goal: Will not experience complications related to urinary retention Outcome: Adequate for Discharge   Problem: Pain Managment: Goal: General experience of comfort will improve Outcome: Adequate for Discharge   Problem: Safety: Goal: Ability to remain free from injury will improve Outcome: Adequate for Discharge   Problem: Skin Integrity: Goal: Risk for impaired skin integrity will decrease Outcome: Adequate for Discharge   Problem: Activity: Goal: Ability to avoid complications of mobility impairment will improve Outcome: Adequate for Discharge Goal: Ability to tolerate increased activity will improve Outcome: Adequate for Discharge Goal: Will remain free from falls Outcome: Adequate for Discharge   Problem: Bowel/Gastric: Goal: Gastrointestinal status for postoperative course will improve Outcome: Adequate for Discharge   Problem: Education: Goal: Ability to verbalize activity precautions or restrictions will improve Outcome: Adequate for Discharge Goal: Knowledge  of the prescribed therapeutic regimen will improve Outcome: Adequate for Discharge Goal: Understanding of discharge needs will improve Outcome: Adequate for Discharge   Problem: Physical Regulation: Goal: Ability to maintain clinical measurements within normal limits will improve Outcome: Adequate for Discharge Goal: Postoperative complications will be avoided or minimized Outcome: Adequate for Discharge Goal: Diagnostic test results will improve Outcome: Adequate for Discharge   Problem: Pain Management: Goal: Pain level will decrease Outcome: Adequate for Discharge   Problem: Skin Integrity: Goal: Signs of wound healing will improve Outcome: Adequate for Discharge   Problem: Health Behavior/Discharge Planning: Goal: Identification of resources available to assist in meeting health care needs will improve Outcome: Adequate for Discharge   Problem: Bladder/Genitourinary: Goal: Urinary functional status for postoperative course will improve Outcome: Adequate for Discharge

## 2017-07-04 NOTE — Discharge Summary (Signed)
Physician Discharge Summary  Patient ID: DEVYNN SCHEFF MRN: [id number] DOB/AGE: 11-Jan-1971 47 y.o.  Admit date: 07/02/2017 Discharge date: 07/04/2017  Admission Diagnoses: C5-6 and C6-7 herniated disc, spondylosis, fracture, subluxation, spinal stenosis, cervical radiculopathy, cervical myelopathy  Discharge Diagnoses: The same Principal Problem:   Cervical vertebral fracture (HCC) Active Problems:   Type 2 diabetes mellitus with peripheral neuropathy (HCC)   Essential hypertension   Leukocytosis   Accidental fall on or from stairs or steps   C7 cervical fracture [facility])   Discharged Condition: good  Hospital Course: I performed a C5-6 and C6-7 anterior cervical discectomy, fusion and plating on the patient on 07/03/2017.  The surgery went well.  The patient's postoperative course was unremarkable.  On postoperative day #1 the patient felt much better and he requested discharge to home.  The patient, and his wife, were given written and oral discharge instructions.  All their questions were answered.  Consults: None Significant Diagnostic Studies: Cervical CT, cervical MRI Treatments: C5-6 and C6-7 anterior cervical discectomy, fusion and plating. Discharge Exam: Blood pressure 123/83, pulse 89, temperature 98 F (36.7 C), temperature source Oral, resp. rate 20, height  (1.803 m), weight 115.7 kg (255 lb), SpO2 96 %. The patient is alert and pleasant.  He looks and feels well.  His dressing is clean and dry.  There is no hematoma or shift.  His strength is grossly normal in all 4 extremities.  Disposition: Home  Discharge Instructions    Call MD for:  difficulty breathing, headache or visual disturbances   Complete by:  As directed    Call MD for:  extreme fatigue   Complete by:  As directed    Call MD for:  hives   Complete by:  As directed    Call MD for:  persistant dizziness or light-headedness   Complete by:  As directed    Call MD for:  persistant nausea and  vomiting   Complete by:  As directed    Call MD for:  redness, tenderness, or signs of infection (pain, swelling, redness, odor or green/yellow discharge around incision site)   Complete by:  As directed    Call MD for:  severe uncontrolled pain   Complete by:  As directed    Call MD for:  temperature >100.4   Complete by:  As directed    Diet - low sodium heart healthy   Complete by:  As directed    Discharge instructions   Complete by:  As directed    Call [phone] for a followup appointment. Take a stool softener while you are using pain medications.   Driving Restrictions   Complete by:  As directed    Do not drive for 2 weeks.   Increase activity slowly   Complete by:  As directed    Lifting restrictions   Complete by:  As directed    Do not lift more than 5 pounds. No excessive bending or twisting.   May shower / Bathe   Complete by:  As directed    Remove the dressing for 3 days after surgery.  You may shower, but leave the incision alone.   Remove dressing in 48 hours   Complete by:  As directed    Your stitches are under the scan and will dissolve by themselves. The Steri-Strips will fall off after you take a few showers. Do not rub back or pick at the wound, Leave the wound alone.     Allergies as  of 07/04/2017      Reactions   Penicillins Rash      Medication List    STOP taking these medications   traMADol 50 MG tablet Commonly known as:  ULTRAM     TAKE these medications   Colchicine 0.6 MG Caps Commonly known as:  MITIGARE 1 cap bid for 5-7 days. In the future 2 caps once with acute onset. What changed:    how much to take  how to take this  when to take this  reasons to take this  additional instructions   cyclobenzaprine 10 MG tablet Commonly known as:  FLEXERIL Take 1 tablet (10 mg total) by mouth 3 (three) times daily as needed for muscle spasms.   glucose blood test strip Commonly known as:  ONETOUCH VERIO Use once daily for glucose  control, dx 250.00   lisinopril 5 MG tablet Commonly known as:  PRINIVIL,ZESTRIL TAKE AS INSTRUCTED BY YOUR PRESCRIBER   MAGNESIUM PO Take 1 capsule by mouth 2 (two) times daily.   metFORMIN 500 MG 24 hr tablet Commonly known as:  GLUCOPHAGE-XR TAKE 4 TABLETS (2000 MG) WITH DINNER   MULTIVITAMIN ADULTS Tabs Take 1 tablet by mouth at bedtime.   onetouch ultrasoft lancets Use once daily for glucose control. Dx 250.00   oxyCODONE 5 MG immediate release tablet Commonly known as:  Oxy IR/ROXICODONE Take 1 tablet (5 mg total) by mouth every 4 (four) hours as needed for moderate pain ((score 4 to 6)).   simvastatin 10 MG tablet Commonly known as:  ZOCOR TAKE 1 TABLET AT BEDTIME   sitaGLIPtin 100 MG tablet Commonly known as:  JANUVIA Take 1 tablet (100 mg total) by mouth daily.        Signed: Cristi Loron 07/04/2017, 10:26 AM

## 2017-07-05 ENCOUNTER — Telehealth: Payer: Self-pay | Admitting: Internal Medicine

## 2017-07-05 MED FILL — Thrombin For Soln 20000 Unit: CUTANEOUS | Qty: 1 | Status: AC

## 2017-07-05 MED FILL — Thrombin For Soln 5000 Unit: CUTANEOUS | Qty: 5000 | Status: AC

## 2017-07-05 NOTE — Telephone Encounter (Signed)
Patient was recently in the hospital and they have changed his medication. The patients wife is calling to see what he needs to do to start back on all of his regular medication that Dr Elvera Lennox had him on.   Please advise

## 2017-07-05 NOTE — Telephone Encounter (Signed)
Spoke to patient. He says he just felt like he should go back to Dr. Timoteo Expose original med instructions pre-surgery, so he did this a.m. Will call if any adjustments needed.

## 2017-07-09 ENCOUNTER — Encounter (HOSPITAL_COMMUNITY): Payer: Self-pay | Admitting: Neurosurgery

## 2017-07-27 DIAGNOSIS — M542 Cervicalgia: Secondary | ICD-10-CM | POA: Diagnosis not present

## 2017-08-19 ENCOUNTER — Encounter: Payer: Self-pay | Admitting: Internal Medicine

## 2017-08-19 ENCOUNTER — Ambulatory Visit (INDEPENDENT_AMBULATORY_CARE_PROVIDER_SITE_OTHER): Payer: BLUE CROSS/BLUE SHIELD | Admitting: Internal Medicine

## 2017-08-19 VITALS — BP 142/84 | HR 90 | Ht 71.0 in | Wt 248.2 lb

## 2017-08-19 DIAGNOSIS — E1142 Type 2 diabetes mellitus with diabetic polyneuropathy: Secondary | ICD-10-CM | POA: Diagnosis not present

## 2017-08-19 DIAGNOSIS — E785 Hyperlipidemia, unspecified: Secondary | ICD-10-CM | POA: Diagnosis not present

## 2017-08-19 LAB — LIPID PANEL
Cholesterol: 140 mg/dL (ref 0–200)
HDL: 44.5 mg/dL (ref >39.00)
NONHDL: 95.92
Total CHOL/HDL Ratio: 3
Triglycerides: 202 mg/dL — ABNORMAL HIGH (ref 0.0–149.0)
VLDL: 40.4 mg/dL — AB (ref 0.0–40.0)

## 2017-08-19 LAB — LDL CHOLESTEROL, DIRECT: Direct LDL: 75 mg/dL

## 2017-08-19 MED ORDER — SITAGLIPTIN PHOSPHATE 100 MG PO TABS: 100.0000 mg | ORAL_TABLET | Freq: Every day | ORAL | 3 refills | Status: DC

## 2017-08-19 MED ORDER — METFORMIN HCL ER 500 MG PO TB24: ORAL_TABLET | ORAL | 3 refills | Status: DC

## 2017-08-19 NOTE — Patient Instructions (Addendum)
Please continue: - Metformin ER 2000 mg with dinner. - Januvia 100 mg before first meal of the day  Please check with the pharmacy or your insurance if the following medicines are covered: - Trulicity (once a week) - Ozempic (once a week) - Bydureon (once a week) - Victoza (once a day)  Please call and schedule an eye appt with Dr. Randon GoldsmithLyles: [facility]:  Dr. Jeni SallesGraham W. Lyles MD ?  Address: [address], [place], [place] [address]  [phone]  Please return in 4-6 months with your sugar log.

## 2017-08-19 NOTE — Progress Notes (Addendum)
Patient ID: Nicholas [name], male   DOB: 06-Sep-1970, 47 y.o.   MRN: [id number]   HPI: Nicholas [name] is a 47 y.o.-year-old male, returning for follow-up for DM2, dx in ~2012, non-insulin-dependent, uncontrolled, with complications (PN). Last visit 1 year and 1 mo ago!  He fell down stairs few weeks ago >> had surgery.  Last hemoglobin A1c was: Lab Results  Component Value Date   HGBA1C 6.6 (H) 07/03/2017   HGBA1C 6.5 07/24/2016   HGBA1C 7.2 10/14/2015  He was on a Prednisone taper for 7 days for neck pain >> 01/2015.  Pt is on a regimen of: - Metformin ER 2000 mg with dinner. - Januvia 100 mg before first meal of the day Had diarrhea, some nausea - with regular Metformin, but not with the ER formulation He was on Glipizide before >> had to stop b/c hypoglycemia.  Pt checks his sugars 1x a day: - am: 175-200, 213 >> 131, 140-158 >> 103-155 (ave 138) >> 135-140, 150 - 2h after b'fast: n/c - before lunch: 170s >> 121-144 >> 120-140 >> 140 - 2h after lunch: n/c >> 121 >> n/c - before dinner: n/c >> 117-125, 200 >> 121-141 >> 115 - 2h after dinner: n/c >> 147-134, 188 >> 121-175, 220 >> 165-200, 220 - bedtime: n/c - nighttime: n/c Lowest sugar was 103 >> 110; he has hypoglycemia awareness in the 70s Highest sugar was 220 >> 220  Glucometer: Verio IQ  Pt's meals are: - Breakfast: skips usually - Lunch: sandwich/salad - Dinner: varies - Snacks: rarely, 1-2  - No CKD, last BUN/creatinine:  Lab Results  Component Value Date   BUN 12 07/03/2017   BUN 16 07/02/2017   CREATININE 0.76 07/03/2017   CREATININE 0.84 07/02/2017  On Lisinopril. - + HL; last set of lipids: Lab Results  Component Value Date   CHOL 153 09/04/2016   HDL 37.40 (L) 09/04/2016   LDLDIRECT 83.0 09/04/2016   TRIG 261.0 (H) 09/04/2016   CHOLHDL 4 09/04/2016  On Simvastatin. - last eye exam was in 2014 >> No DR. Did not schedule a new appt yet. - + numbness in his feet. On ASA  81.  ROS: Constitutional: no weight gain/no weight loss, no fatigue, no subjective hyperthermia, no subjective hypothermia Eyes: no blurry vision, no xerophthalmia ENT: no sore throat, no nodules palpated in throat, no dysphagia, no odynophagia, no hoarseness Cardiovascular: no CP/no SOB/no palpitations/no leg swelling Respiratory: no cough/no SOB/no wheezing Gastrointestinal: no N/no V/no D/no C/no acid reflux Musculoskeletal: no muscle aches/no joint aches Skin: no rashes, no hair loss Neurological: no tremors/+ numbness/no tingling/no dizziness  I reviewed pt's medications, allergies, PMH, social hx, family hx, and changes were documented in the history of present illness. Otherwise, unchanged from my initial visit note.  Patient Active Problem List   Diagnosis Date Noted  . Type 2 diabetes mellitus with peripheral neuropathy (HCC) 06/05/2010    Priority: High  . Cervical vertebral fracture (HCC) 07/03/2017  . Essential hypertension 07/03/2017  . Leukocytosis 07/03/2017  . Accidental fall on or from stairs or steps 07/03/2017  . C7 cervical fracture (HCC) 07/03/2017  . Left shoulder pain 12/19/2014  . Hyperlipidemia 03/06/2008  . PREMATURE VENTRICULAR CONTRACTIONS 11/07/2007   Past surgical history: - Appendectomy in 1982   Social History   Social History  . Marital status: Married    Spouse name: N/A  . Number of children: 1- Stepson    Occupational History  . Scientist    Social  History Main Topics  . Smoking status: Former Smoker    Quit date: 03/10/2007  . Smokeless tobacco: Never Used  . Alcohol use No, 1 or 2 drinks per year   . Drug use: No   Current Outpatient Medications on File Prior to Visit  Medication Sig Dispense Refill  . Colchicine (MITIGARE) 0.6 MG CAPS 1 cap bid for 5-7 days. In the future 2 caps once with acute onset. (Patient taking differently: Take 1 capsule by mouth daily as needed (pain). ) 60 capsule 0  . cyclobenzaprine (FLEXERIL) 10 MG  tablet Take 1 tablet (10 mg total) by mouth 3 (three) times daily as needed for muscle spasms. 50 tablet 1  . glucose blood (ONETOUCH VERIO) test strip Use once daily for glucose control, dx 250.00 100 each 3  . Lancets (ONETOUCH ULTRASOFT) lancets Use once daily for glucose control. Dx 250.00 100 each 12  . lisinopril (PRINIVIL,ZESTRIL) 5 MG tablet TAKE AS INSTRUCTED BY YOUR PRESCRIBER 90 tablet 4  . MAGNESIUM PO Take 1 capsule by mouth 2 (two) times daily.    . metFORMIN (GLUCOPHAGE-XR) 500 MG 24 hr tablet TAKE 4 TABLETS (2000 MG) WITH DINNER 360 tablet 3  . Multiple Vitamins-Minerals (MULTIVITAMIN ADULTS) TABS Take 1 tablet by mouth at bedtime.    Marland Kitchen oxyCODONE (OXY IR/ROXICODONE) 5 MG immediate release tablet Take 1 tablet (5 mg total) by mouth every 4 (four) hours as needed for moderate pain ((score 4 to 6)). 30 tablet 0  . simvastatin (ZOCOR) 10 MG tablet TAKE 1 TABLET AT BEDTIME 90 tablet 4  . sitaGLIPtin (JANUVIA) 100 MG tablet Take 1 tablet (100 mg total) by mouth daily. 90 tablet 3   No current facility-administered medications on file prior to visit.    Allergies  Allergen Reactions  . Penicillins Rash   Family history: - See history of present illness, also: - HTN and thyroid disease in grandfather  PE: BP (!) 142/84   Pulse 90   Ht 5\' 11"  (1.803 m)   Wt 248 lb 3.2 oz (112.6 kg)   SpO2 95%   BMI 34.62 kg/m  Wt Readings from Last 3 Encounters:  08/19/17 248 lb 3.2 oz (112.6 kg)  07/02/17 255 lb (115.7 kg)  03/23/17 269 lb 8 oz (122.2 kg)   Constitutional: overweight, in NAD Eyes: PERRLA, EOMI, no exophthalmos ENT: moist mucous membranes, no thyromegaly, no cervical lymphadenopathy Cardiovascular: RRR, No MRG Respiratory: CTA B Gastrointestinal: abdomen soft, NT, ND, BS+ Musculoskeletal: no deformities, strength intact in all 4 Skin: moist, warm, no rashes Neurological: no tremor with outstretched hands, DTR normal in all 4   ASSESSMENT: 1. DM2,  non-insulin-dependent, uncontrolled, with complications - PN  2. HL  PLAN:  1. Patient with long-standing, uncontrolled, type 2 diabetes, on oral antidiabetic regimen with metformin ER and Januvia, with improved control in the last 2 years.  He returns after long absence of more than a year. Recent HbA1c was  6.6%, only slightly higher than before, but still at goal. - sugars are a little higher, especially in am and after dinner >> discussed about possibly adding a GLP1 R agonist instead of Januvia >> he will check with his insurance if covered but he would like to continue to work on diet for now - I suggested to:  Patient Instructions  Please continue: - Metformin ER 2000 mg with dinner. - Januvia 100 mg before first meal of the day  Please check with the pharmacy or your insurance if the  following medicines are covered: - Trulicity (once a week) - Ozempic (once a week) - Bydureon (once a week) - Victoza (once a day)  Please call and schedule an eye appt with Dr. Randon GoldsmithLyles: [facility]:  Dr. Jeni SallesGraham W. Lyles MD ?  Address: [address], [place], [place] [address]  [phone]  Please return in 4-6 months with your sugar log.   - continue checking sugars at different times of the day - check 1x a day, rotating checks - advised for yearly eye exams >> he is not UTD - Return to clinic in 4- 6 mo with sugar log - per his preference   2. HL - Reviewed latest lipid panel from 08/2016: LDL at goal, lower than 100, but high triglycerides and slightly low HDL - Continues simvastatin without side effects. - check Lipids today >> he is fasting  Office Visit on 08/19/2017  Component Date Value Ref Range Status  . Cholesterol 08/19/2017 140  0 - 200 mg/dL Final   ATP III Classification       Desirable:  < 200 mg/dL               Borderline High:  200 - 239 mg/dL          High:  > = 811240 mg/dL  . Triglycerides 08/19/2017 202.0* 0.0 - 149.0 mg/dL Final   Normal:   <914<150 mg/dLBorderline High:  150 - 199 mg/dL  . HDL 08/19/2017 44.50  >39.00 mg/dL Final  . VLDL 78/29/562106/13/2019 40.4* 0.0 - 40.0 mg/dL Final  . Total CHOL/HDL Ratio 08/19/2017 3   Final                  Men          Women1/2 Average Risk     3.4          3.3Average Risk          5.0          4.42X Average Risk          9.6          7.13X Average Risk          15.0          11.0                      . NonHDL 08/19/2017 95.92   Final   NOTE:  Non-HDL goal should be 30 mg/dL higher than patient's LDL goal (i.e. LDL goal of < 70 mg/dL, would have non-HDL goal of < 100 mg/dL)  . Direct LDL 08/19/2017 75.0  mg/dL Final   Optimal:  <308<100 mg/dLNear or Above Optimal:  100-129 mg/dLBorderline High:  130-159 mg/dLHigh:  160-189 mg/dLVery High:  >190 mg/dL   The lipid panel appears better!  Carlus Pavlovristina Deaire Mcwhirter, MD PhD [facility]

## 2017-08-23 ENCOUNTER — Ambulatory Visit: Payer: BLUE CROSS/BLUE SHIELD | Admitting: Internal Medicine

## 2017-10-15 DIAGNOSIS — Z6830 Body mass index (BMI) 30.0-30.9, adult: Secondary | ICD-10-CM | POA: Diagnosis not present

## 2017-10-15 DIAGNOSIS — R030 Elevated blood-pressure reading, without diagnosis of hypertension: Secondary | ICD-10-CM | POA: Diagnosis not present

## 2017-10-15 DIAGNOSIS — M542 Cervicalgia: Secondary | ICD-10-CM | POA: Diagnosis not present

## 2018-02-05 ENCOUNTER — Other Ambulatory Visit: Payer: Self-pay | Admitting: Family Medicine

## 2018-02-25 ENCOUNTER — Ambulatory Visit: Payer: BLUE CROSS/BLUE SHIELD | Admitting: Internal Medicine

## 2018-04-04 DIAGNOSIS — J101 Influenza due to other identified influenza virus with other respiratory manifestations: Secondary | ICD-10-CM | POA: Diagnosis not present

## 2018-06-03 ENCOUNTER — Ambulatory Visit: Payer: BLUE CROSS/BLUE SHIELD | Admitting: Internal Medicine

## 2018-06-13 ENCOUNTER — Ambulatory Visit: Payer: BLUE CROSS/BLUE SHIELD | Admitting: Internal Medicine

## 2018-07-28 ENCOUNTER — Other Ambulatory Visit: Payer: Self-pay

## 2018-07-28 ENCOUNTER — Encounter: Payer: Self-pay | Admitting: Internal Medicine

## 2018-07-28 ENCOUNTER — Ambulatory Visit (INDEPENDENT_AMBULATORY_CARE_PROVIDER_SITE_OTHER): Payer: BLUE CROSS/BLUE SHIELD | Admitting: Internal Medicine

## 2018-07-28 DIAGNOSIS — E1142 Type 2 diabetes mellitus with diabetic polyneuropathy: Secondary | ICD-10-CM

## 2018-07-28 DIAGNOSIS — E785 Hyperlipidemia, unspecified: Secondary | ICD-10-CM

## 2018-07-28 MED ORDER — LISINOPRIL 5 MG PO TABS: ORAL_TABLET | ORAL | 4 refills | Status: DC

## 2018-07-28 MED ORDER — SIMVASTATIN 10 MG PO TABS: 10.0000 mg | ORAL_TABLET | Freq: Every day | ORAL | 4 refills | Status: DC

## 2018-07-28 MED ORDER — METFORMIN HCL ER 500 MG PO TB24: ORAL_TABLET | ORAL | 3 refills | Status: DC

## 2018-07-28 MED ORDER — DAPAGLIFLOZIN PROPANEDIOL 10 MG PO TABS: 10.0000 mg | ORAL_TABLET | Freq: Every day | ORAL | 11 refills | Status: DC

## 2018-07-28 NOTE — Patient Instructions (Addendum)
Please continue: - Metformin ER 2000 mg with dinner. - Januvia 100 mg before first meal of the day  Please add: - Farxiga 10 mg before b'fast  Please check with the pharmacy or your insurance if the following medicines are covered: - Trulicity (once a week) - Ozempic (once a week) - Bydureon (once a week) - Victoza (once a day)  Also -instead of Farxiga: - Jardiance - Invokana - Steglatro  Please call and schedule an eye appt with Dr. Randon Goldsmith: [facility]:  Dr. Jeni Salles MD ?  Address: [address] [place], [place], [place] [address]  [phone]  Please let me know if the sugars are consistently <80 or >200.  Please return in 4-6 months with your sugar log.

## 2018-07-28 NOTE — Progress Notes (Signed)
Patient ID: Nicholas [name], male   DOB: April 14, 1970, 48 y.o.   MRN: [id number]   Patient location: Home My location: Office  Referring Provider: Roderick Pee, MD (Inactive)  I connected with the patient on 07/28/18 at  2:20 PM EDT by a video enabled telemedicine application and verified that I am speaking with the correct person.   I discussed the limitations of evaluation and management by telemedicine and the availability of in person appointments. The patient expressed understanding and agreed to proceed.   Details of the encounter are shown below.  HPI: Nicholas [name] is a 48 y.o.-year-old male, presenting for follow-up for DM2, dx in ~2012, non-insulin-dependent, uncontrolled, with complications (PN). Last visit almost 1 year ago!  Previous visit was 1 year and 1 months before!  Last hemoglobin A1c was: Lab Results  Component Value Date   HGBA1C 6.6 (H) 07/03/2017   HGBA1C 6.5 07/24/2016   HGBA1C 7.2 10/14/2015  He was on a Prednisone taper for 7 days for neck pain >> 01/2015.  Pt is on a regimen of: - Metformin ER 2000 mg with dinner - Januvia 100 mg before first meal of the day Had diarrhea, some nausea - with regular Metformin, but not with the ER formulation He was on Glipizide before >> had to stop b/c hypoglycemia.  Pt checks his sugars once a day: 90 day ave: 144  - am:  103-155 (ave 138) >> 135-140, 150 >> 136-171 - 2h after b'fast: n/c - before lunch: 120-140 >> 140 >> 140-142 - 2h after lunch: n/c >> 121 >> n/c - before dinner: 121-141 >> 115 >> 121-168 - 2h after dinner: 121-175, 220 >> 165-200, 220 >> 165 - bedtime: n/c - nighttime: n/c Lowest sugar was 103 >> 110 >> 121; he has hypoglycemia awareness in the 70s. Highest sugar was 220 >> 220 >> 179.  Glucometer: Verio IQ  Pt's meals are: - Breakfast: skips usually - Lunch: sandwich/salad - Dinner: varies - Snacks: rarely, 1-2  -No CKD, last BUN/creatinine:  Lab Results  Component Value Date    BUN 12 07/03/2017   BUN 16 07/02/2017   CREATININE 0.76 07/03/2017   CREATININE 0.84 07/02/2017  On lisinopril. -+ HL; last set of lipids: Lab Results  Component Value Date   CHOL 140 08/19/2017   HDL 44.50 08/19/2017   LDLDIRECT 75.0 08/19/2017   TRIG 202.0 (H) 08/19/2017   CHOLHDL 3 08/19/2017  On simvastatin. - last eye exam was in 2014: No DR.  He did not schedule another appointment despite multiple promptings. -+ Numbness in his feet. On ASA 81.  ROS: Constitutional: no weight gain/no weight loss, no fatigue, no subjective hyperthermia, no subjective hypothermia Eyes: no blurry vision, no xerophthalmia ENT: no sore throat, no nodules palpated in neck, no dysphagia, no odynophagia, no hoarseness Cardiovascular: no CP/no SOB/no palpitations/no leg swelling Respiratory: no cough/no SOB/no wheezing Gastrointestinal: no N/no V/no D/no C/no acid reflux Musculoskeletal: no muscle aches/+ joint aches (low back pain) Skin: no rashes, no hair loss Neurological: no tremors/+ numbness/no tingling/no dizziness  I reviewed pt's medications, allergies, PMH, social hx, family hx, and changes were documented in the history of present illness. Otherwise, unchanged from my initial visit note.  Patient Active Problem List   Diagnosis Date Noted  . Type 2 diabetes mellitus with peripheral neuropathy (HCC) 06/05/2010    Priority: High  . Cervical vertebral fracture (HCC) 07/03/2017  . Essential hypertension 07/03/2017  . Leukocytosis 07/03/2017  . Accidental fall on  or from stairs or steps 07/03/2017  . C7 cervical fracture (HCC) 07/03/2017  . Left shoulder pain 12/19/2014  . Hyperlipidemia 03/06/2008  . PREMATURE VENTRICULAR CONTRACTIONS 11/07/2007   Past surgical history: - Appendectomy in 1982   Social History   Social History  . Marital status: Married    Spouse name: N/A  . Number of children: 1- Stepson    Occupational History  . Scientist    Social History Main Topics   . Smoking status: Former Smoker    Quit date: 03/10/2007  . Smokeless tobacco: Never Used  . Alcohol use No, 1 or 2 drinks per year   . Drug use: No   Current Outpatient Medications on File Prior to Visit  Medication Sig Dispense Refill  . glucose blood (ONETOUCH VERIO) test strip Use once daily for glucose control, dx 250.00 100 each 3  . Lancets (ONETOUCH ULTRASOFT) lancets Use once daily for glucose control. Dx 250.00 100 each 12  . lisinopril (PRINIVIL,ZESTRIL) 5 MG tablet TAKE AS INSTRUCTED BY YOUR PRESCRIBER 90 tablet 4  . MAGNESIUM PO Take 1 capsule by mouth 2 (two) times daily.    . metFORMIN (GLUCOPHAGE-XR) 500 MG 24 hr tablet TAKE 4 TABLETS (2000 MG) WITH DINNER 360 tablet 3  . Multiple Vitamins-Minerals (MULTIVITAMIN ADULTS) TABS Take 1 tablet by mouth at bedtime.    . simvastatin (ZOCOR) 10 MG tablet TAKE 1 TABLET AT BEDTIME 90 tablet 4  . sitaGLIPtin (JANUVIA) 100 MG tablet Take 1 tablet (100 mg total) by mouth daily. 90 tablet 3   No current facility-administered medications on file prior to visit.    Allergies  Allergen Reactions  . Penicillins Rash   Family history: - See history of present illness, also: - HTN and thyroid disease in grandfather  PE: There were no vitals taken for this visit. Wt Readings from Last 3 Encounters:  08/19/17 248 lb 3.2 oz (112.6 kg)  07/02/17 255 lb (115.7 kg)  03/23/17 269 lb 8 oz (122.2 kg)   Constitutional:  in NAD  The physical exam was not performed (virtual visit).  ASSESSMENT: 1. DM2, non-insulin-dependent, uncontrolled, with complications - PN  2. HL  3.  Obesity  PLAN:  1. Patient with longstanding, previously uncontrolled, type 2 diabetes, on oral antidiabetic regimen, now with better control in the last 2 years.  At last visit, we continued his metformin and DPP 4 inhibitor but we did discuss about the possibility of switching to a GLP-1 receptor agonist.  He checked with his insurance at that time and these were  not covered. -At this visit, sugars are higher and I suggested adding an SGLT 2 inhibitor.  We will try Marcelline Deist, which should be covered by his insurance.  We discussed about the possibility of using other SGLT2 inhibitors and also GLP-1 receptor agonists, depending on his insurance preference. -For now, we will continue metformin but I advised him to not refill Januvia when he runs out - I suggested to:  Patient Instructions  Please continue: - Metformin ER 2000 mg with dinner. - Januvia 100 mg before first meal of the day  Please add: - Farxiga 10 mg before b'fast  Please check with the pharmacy or your insurance if the following medicines are covered: - Trulicity (once a week) - Ozempic (once a week) - Bydureon (once a week) - Victoza (once a day)  Also -instead of Farxiga: - Jardiance - Invokana - Steglatro  Please call and schedule an eye appt with Dr. Randon Goldsmith:  [facility]:  Dr. Jeni SallesGraham W. Lyles MD ?  Address: [address], [place], [place] [address]  [phone]  Please let me know if the sugars are consistently <80 or >200.  Please return in 4-6 months with your sugar log.   - We will check his HbA1c level when he returns to the clinic - continue checking sugars at different times of the day - check 1x a day, rotating checks - advised for yearly eye exams >> he is still not UTD - Return to clinic in 4-6 mo (per his preference) with sugar log   2. HL - Reviewed latest lipid panel from almost a year ago: LDL at goal, triglycerides high, HDL at goal Lab Results  Component Value Date   CHOL 140 08/19/2017   HDL 44.50 08/19/2017   LDLDIRECT 75.0 08/19/2017   TRIG 202.0 (H) 08/19/2017   CHOLHDL 3 08/19/2017  - Continues simvastatin without side effects.  3. Obesity - weight overall stable since last OV -We will try to add an SGLT 2 inhibitor which should also help with weight loss  At this visit, I strongly advised him to schedule an  appointment with a new PCP, since Dr. Tawanna Coolerodd retired.  Carlus Pavlovristina Yogesh Cominsky, MD PhD [facility]

## 2018-08-28 ENCOUNTER — Encounter: Payer: Self-pay | Admitting: Internal Medicine

## 2018-09-20 ENCOUNTER — Encounter: Payer: Self-pay | Admitting: Internal Medicine

## 2018-09-21 MED ORDER — TRULICITY 0.75 MG/0.5ML ~~LOC~~ SOAJ: SUBCUTANEOUS | 0 refills | Status: DC

## 2018-10-24 ENCOUNTER — Encounter: Payer: Self-pay | Admitting: Internal Medicine

## 2018-10-25 ENCOUNTER — Other Ambulatory Visit: Payer: Self-pay | Admitting: Internal Medicine

## 2018-10-25 MED ORDER — TRULICITY 0.75 MG/0.5ML ~~LOC~~ SOAJ: SUBCUTANEOUS | 3 refills | Status: DC

## 2019-01-20 ENCOUNTER — Other Ambulatory Visit: Payer: Self-pay

## 2019-01-20 MED ORDER — TRULICITY 0.75 MG/0.5ML ~~LOC~~ SOAJ: SUBCUTANEOUS | 3 refills | Status: DC

## 2019-07-25 ENCOUNTER — Other Ambulatory Visit: Payer: Self-pay | Admitting: Internal Medicine

## 2019-07-25 DIAGNOSIS — E1142 Type 2 diabetes mellitus with diabetic polyneuropathy: Secondary | ICD-10-CM

## 2019-10-23 ENCOUNTER — Other Ambulatory Visit: Payer: Self-pay | Admitting: Internal Medicine

## 2019-10-25 ENCOUNTER — Other Ambulatory Visit: Payer: Self-pay | Admitting: Internal Medicine

## 2019-10-30 ENCOUNTER — Other Ambulatory Visit: Payer: Self-pay | Admitting: Internal Medicine

## 2019-12-08 IMAGING — CT CT CERVICAL SPINE W/O CM
5 of 8 series · 12 of 33 positions shown, 13 images · non-contrast
Comparison: None.

CLINICAL DATA: Fall backwards down steps with posterior head
injury. Initial encounter.

EXAM:
CT HEAD WITHOUT CONTRAST
CT CERVICAL SPINE WITHOUT CONTRAST
TECHNIQUE: Multidetector CT imaging of the head and cervical spine was
performed following the standard protocol without intravenous
contrast. Multiplanar CT image reconstructions of the cervical spine
were also generated.

[Series 3: head 2.0 h70h · axial · 0.45mm/px · z∈[-97,-41]mm · 2 of 85 slices shown]
[im 29/85  bone]
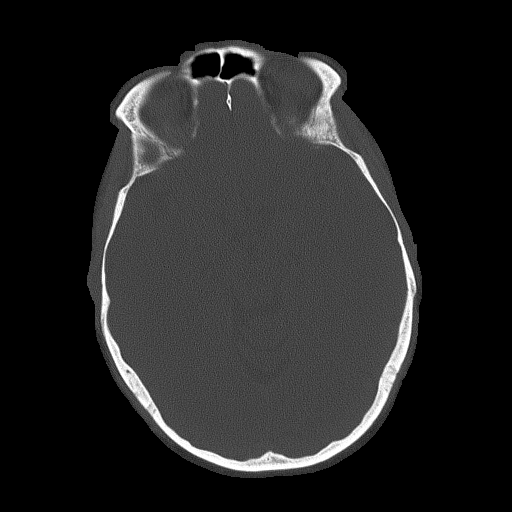
[im 57/85  bone]
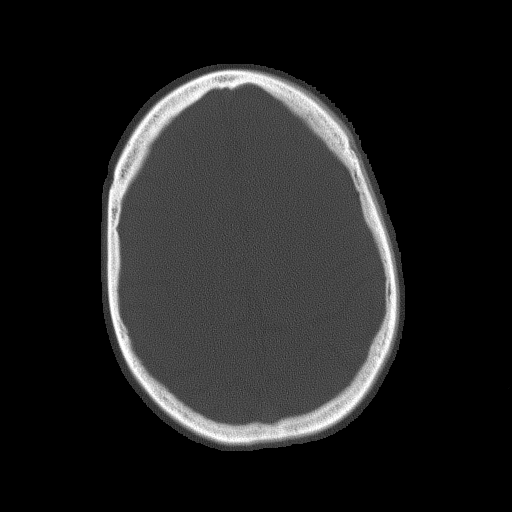

[Series 7: c_spine 2.0 i30s 3 · axial · 0.34mm/px · z∈[-238,-174]mm · 2 of 96 slices shown]
[im 32/96  bone]
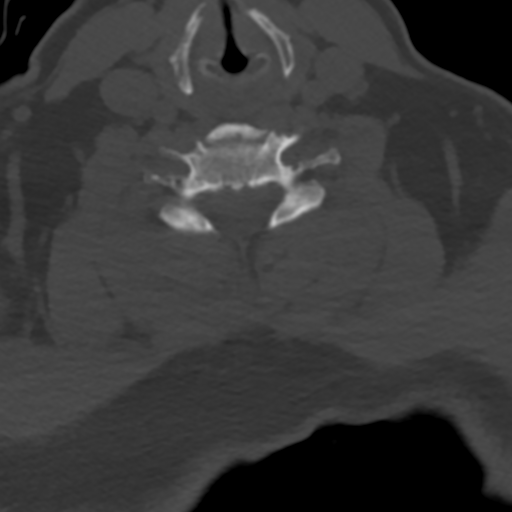
[im 64/96  bone]
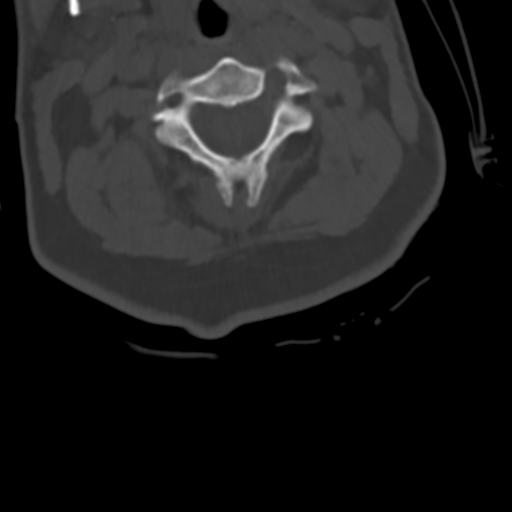

[Series 9: coronals · coronal · 0.28mm/px · 1 of 61 slices shown]
[im 31/61  bone]
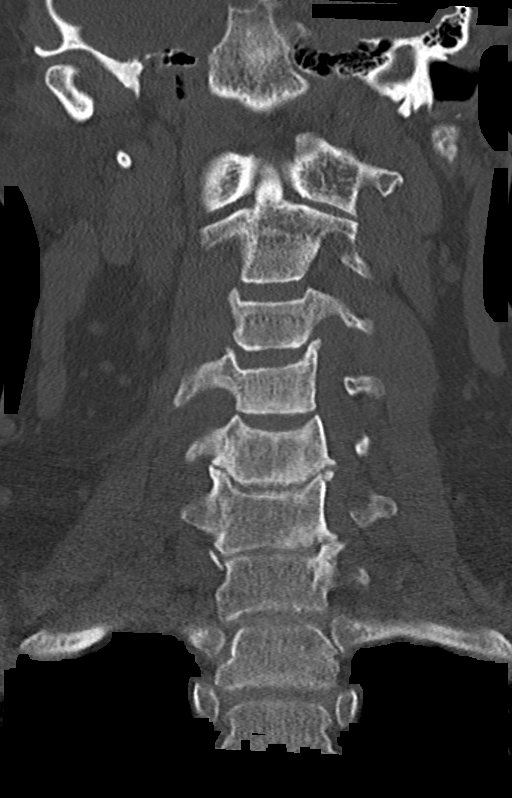

[Series 10: sagittals · sagittal · 0.26mm/px · 4 of 48 slices shown]
[im 10/48  bone]
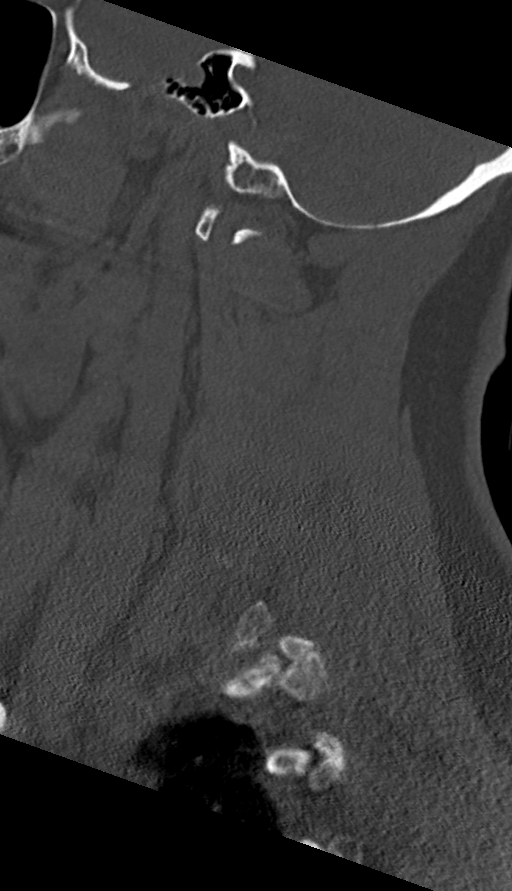
[im 19/48  bone]
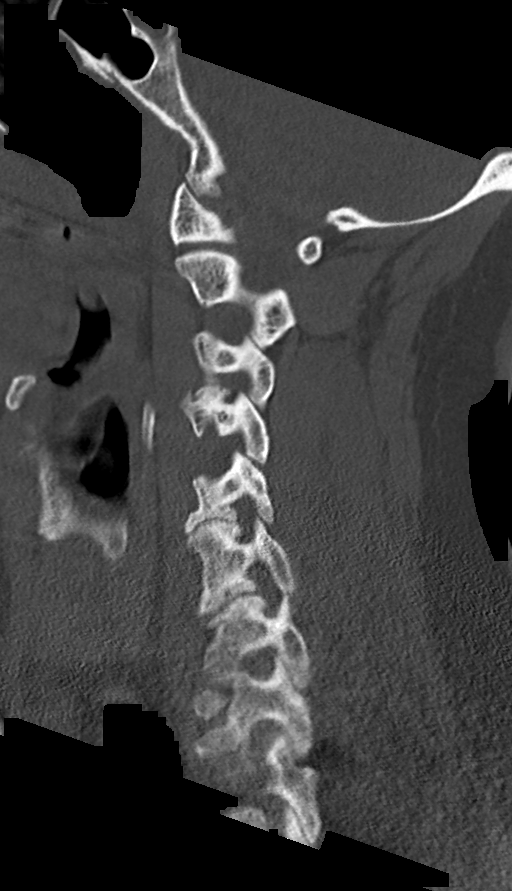
[im 29/48  bone]
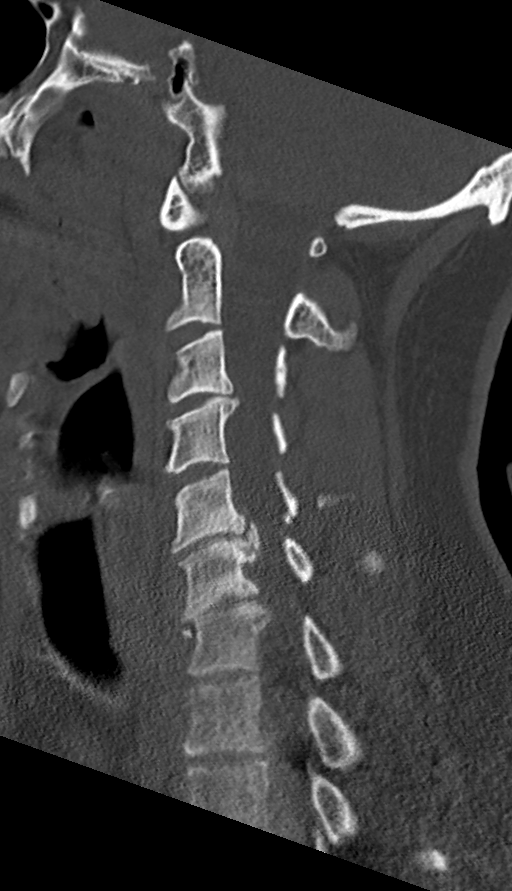
[im 38/48  bone]
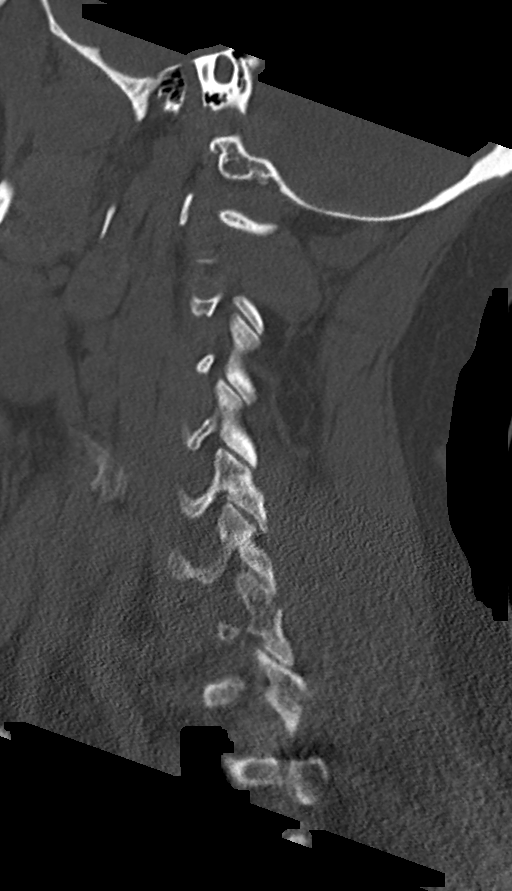

[Series 11: orthogonals · axial · 0.23mm/px · z∈[-280,-178]mm · 3 of 111 slices shown, 4 images]
[im 28/111  soft-tissue]
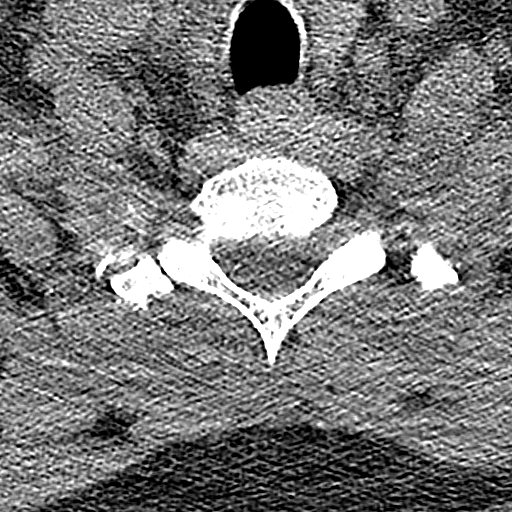
[im 28/111  bone]
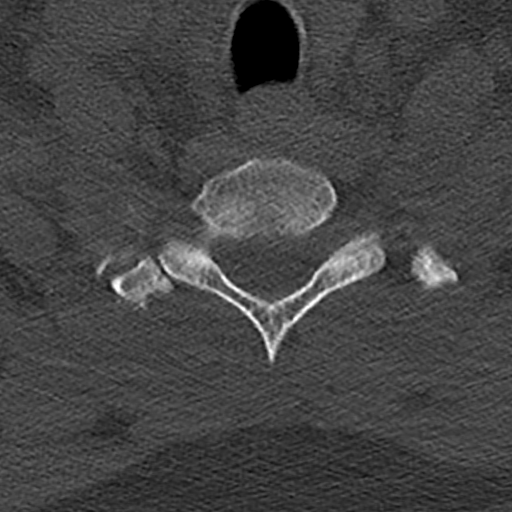
[im 56/111  bone]
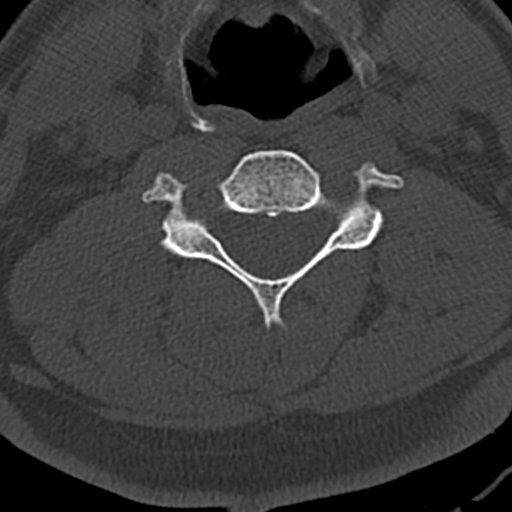
[im 83/111  bone]
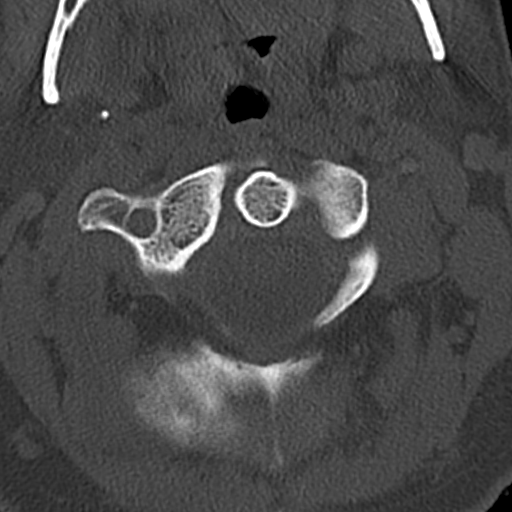

[12 of 33 positions shown; findings below may reference images not displayed]

FINDINGS: CT HEAD FINDINGS

Brain: No evidence of acute infarction, hemorrhage, hydrocephalus,
extra-axial collection or mass lesion/mass effect.

Vascular: No hyperdense vessel or unexpected calcification.

Skull: Normal. Negative for fracture or focal lesion.

Sinuses/Orbits: No acute finding.

Other: Tiny fleck of density along the posterior left scalp near the
vertex may represent a small foreign body on, or just within, the
superficial scalp soft tissues.

CT CERVICAL SPINE FINDINGS

Alignment: The cervical spine demonstrates normal alignment without
subluxation.

Skull base and vertebrae: No evidence of acute fracture or focal
bony lesions.

Soft tissues and spinal canal: No soft tissue swelling identified.

Disc levels: Moderate degenerative disc disease present at C5-6 and
C6-7.

Upper chest: Negative.
IMPRESSION: 1. No evidence of acute brain injury or skull fracture. There is a
tiny density along the posterior left scalp near the vertex which
may represent a small foreign body on, or within, the superficial
scalp.
2. No evidence of acute cervical injury. Moderate degenerative disc
disease is present at C5-6 and C6-7.

## 2020-01-05 ENCOUNTER — Encounter: Payer: Self-pay | Admitting: Internal Medicine

## 2020-01-05 ENCOUNTER — Ambulatory Visit (INDEPENDENT_AMBULATORY_CARE_PROVIDER_SITE_OTHER): Payer: BC Managed Care – PPO | Admitting: Internal Medicine

## 2020-01-05 ENCOUNTER — Other Ambulatory Visit: Payer: Self-pay

## 2020-01-05 VITALS — BP 138/90 | HR 94 | Ht 71.0 in | Wt 245.2 lb

## 2020-01-05 DIAGNOSIS — E785 Hyperlipidemia, unspecified: Secondary | ICD-10-CM

## 2020-01-05 DIAGNOSIS — E1142 Type 2 diabetes mellitus with diabetic polyneuropathy: Secondary | ICD-10-CM | POA: Diagnosis not present

## 2020-01-05 DIAGNOSIS — Z23 Encounter for immunization: Secondary | ICD-10-CM

## 2020-01-05 LAB — COMPREHENSIVE METABOLIC PANEL
ALT: 31 U/L (ref 0–53)
AST: 23 U/L (ref 0–37)
Albumin: 4.4 g/dL (ref 3.5–5.2)
Alkaline Phosphatase: 60 U/L (ref 39–117)
BUN: 10 mg/dL (ref 6–23)
CO2: 28 mEq/L (ref 19–32)
Calcium: 9.4 mg/dL (ref 8.4–10.5)
Chloride: 100 mEq/L (ref 96–112)
Creatinine, Ser: 0.79 mg/dL (ref 0.40–1.50)
GFR: 104.29 mL/min (ref >60.00)
Glucose, Bld: 90 mg/dL (ref 70–99)
Potassium: 4.2 mEq/L (ref 3.5–5.1)
Sodium: 138 mEq/L (ref 135–145)
Total Bilirubin: 1.6 mg/dL — ABNORMAL HIGH (ref 0.2–1.2)
Total Protein: 7.5 g/dL (ref 6.0–8.3)

## 2020-01-05 LAB — MICROALBUMIN / CREATININE URINE RATIO
Creatinine,U: 165.4 mg/dL
Microalb Creat Ratio: 2.5 mg/g (ref 0.0–30.0)
Microalb, Ur: 4.1 mg/dL — ABNORMAL HIGH (ref 0.0–1.9)

## 2020-01-05 LAB — POCT GLYCOSYLATED HEMOGLOBIN (HGB A1C): Hemoglobin A1C: 5.5 % (ref 4.0–5.6)

## 2020-01-05 LAB — LIPID PANEL
Cholesterol: 217 mg/dL — ABNORMAL HIGH (ref 0–200)
HDL: 43.5 mg/dL (ref >39.00)
NonHDL: 173.19
Total CHOL/HDL Ratio: 5
Triglycerides: 256 mg/dL — ABNORMAL HIGH (ref 0.0–149.0)
VLDL: 51.2 mg/dL — ABNORMAL HIGH (ref 0.0–40.0)

## 2020-01-05 LAB — LDL CHOLESTEROL, DIRECT: Direct LDL: 138 mg/dL

## 2020-01-05 NOTE — Patient Instructions (Addendum)
Please continue: - Metformin ER 2000 mg with dinner. - Trulicity 0.75 mg weekly  Please call and schedule an eye appt with Dr. Randon Goldsmith: [facility]:  Dr. Jeni Salles MD ?  Address: [address] [place], [place], [place] [address]  [phone]  Please return in 6 months with your sugar log.

## 2020-01-05 NOTE — Progress Notes (Signed)
Patient ID: Paulla ForeJeremi D Caplin, male   DOB: Dec 30, 1970, 49 y.o.   MRN: [id number]   This visit occurred during the SARS-CoV-2 public health emergency.  Safety protocols were in place, including screening questions prior to the visit, additional usage of staff PPE, and extensive cleaning of exam room while observing appropriate contact time as indicated for disinfecting solutions.   HPI: Paulla ForeJeremi D Schwimmer is a 49 y.o.-year-old male, presenting for follow-up for DM2, dx in ~2012, non-insulin-dependent, uncontrolled, with complications (PN).  Last visit 1.5 years ago (virtual).  Previous visits were also spaced 1 year apart...  He again returns after long absence, this time of 1.5 years.  He did not schedule an appointment with another PCP, as advised, after Dr. Tawanna Coolerodd retired.  He did not schedule a new eye exam, as advised repeatedly.  Reviewed HbA1c levels: Lab Results  Component Value Date   HGBA1C 6.6 (H) 07/03/2017   HGBA1C 6.5 07/24/2016   HGBA1C 7.2 10/14/2015  He was on a Prednisone taper for 7 days for neck pain >> 01/2015.  Pt is on a regimen of: - Metformin ER 2000 mg with dinner - Januvia 100 mg before first meal of the day >> Trulicity 0.75 mg weekly - >> stopped... 1 year ago Had diarrhea, some nausea - with regular Metformin, but not with the ER formulation He was on Glipizide before >> had to stop b/c hypoglycemia.  Pt checks his sugars once a day: - am:  135-140, 150 >> 136-171 >> 110-120, 148 - 2h after b'fast: n/c - before lunch: 120-140 >> 140 >> 140-142 >> 110-130 - 2h after lunch: n/c >> 121 >> n/c - before dinner: 121-141 >> 115 >> 121-168 >> 130-150 (snack) - 2h after dinner: 165-200, 220 >> 165 >> 140-160, 170 - bedtime: n/c - nighttime: n/c Lowest sugar was 103 >> 110 >> 121 >> 100 >> 110; he has hypoglycemia awareness in the 70s. Highest sugar was 220 >> 220 >> 179 >> 170s.  Glucometer: Verio IQ  Pt's meals are: - Breakfast: skips usually - Lunch:  sandwich/salad - Dinner: varies - Snacks: rarely, 1-2  -No CKD, last BUN/creatinine:  Lab Results  Component Value Date   BUN 12 07/03/2017   BUN 16 07/02/2017   CREATININE 0.76 07/03/2017   CREATININE 0.84 07/02/2017  On lisinopril - ran out.. -+ HL; last set of lipids: Lab Results  Component Value Date   CHOL 140 08/19/2017   HDL 44.50 08/19/2017   LDLDIRECT 75.0 08/19/2017   TRIG 202.0 (H) 08/19/2017   CHOLHDL 3 08/19/2017  On Zocor - but ran out. - last eye exam was in 2014: No DR.he did not schedule another eye appointment, despite multiple promptings. -he has Numbness in his feet. On ASA 81.  ROS: Constitutional: no weight gain/no weight loss, no fatigue, no subjective hyperthermia, no subjective hypothermia Eyes: no blurry vision, no xerophthalmia ENT: no sore throat, no nodules palpated in neck, no dysphagia, no odynophagia, no hoarseness Cardiovascular: no CP/no SOB/no palpitations/+ R leg swelling Respiratory: no cough/no SOB/no wheezing Gastrointestinal: no N/no V/no D/no C/no acid reflux Musculoskeletal: no muscle aches/joint aches Skin: no rashes, no hair loss Neurological: no tremors/+ numbness/no tingling/no dizziness  I reviewed pt's medications, allergies, PMH, social hx, family hx, and changes were documented in the history of present illness. Otherwise, unchanged from my initial visit note.  Patient Active Problem List   Diagnosis Date Noted  . Type 2 diabetes mellitus with peripheral neuropathy (HCC) 06/05/2010  Priority: High  . Cervical vertebral fracture (HCC) 07/03/2017  . Essential hypertension 07/03/2017  . Leukocytosis 07/03/2017  . Accidental fall on or from stairs or steps 07/03/2017  . C7 cervical fracture (HCC) 07/03/2017  . Left shoulder pain 12/19/2014  . Hyperlipidemia 03/06/2008  . PREMATURE VENTRICULAR CONTRACTIONS 11/07/2007   Past surgical history: - Appendectomy in 1982   Social History   Social History  . Marital  status: Married    Spouse name: N/A  . Number of children: 1- Stepson    Occupational History  . Scientist    Social History Main Topics  . Smoking status: Former Smoker    Quit date: 03/10/2007  . Smokeless tobacco: Never Used  . Alcohol use No, 1 or 2 drinks per year   . Drug use: No   Current Outpatient Medications on File Prior to Visit  Medication Sig Dispense Refill  . dapagliflozin propanediol (FARXIGA) 10 MG TABS tablet Take 10 mg by mouth daily. 30 tablet 11  . Dulaglutide (TRULICITY) 0.75 MG/0.5ML SOPN Inject once a week. 12 pen 3  . glucose blood (ONETOUCH VERIO) test strip Use once daily for glucose control, dx 250.00 100 each 3  . Lancets (ONETOUCH ULTRASOFT) lancets Use once daily for glucose control. Dx 250.00 100 each 12  . lisinopril (ZESTRIL) 5 MG tablet TAKE 1x a day 90 tablet 4  . MAGNESIUM PO Take 1 capsule by mouth 2 (two) times daily.    . metFORMIN (GLUCOPHAGE-XR) 500 MG 24 hr tablet TAKE 4 TABLETS (2000 MG) WITH DINNER 360 tablet 3  . Multiple Vitamins-Minerals (MULTIVITAMIN ADULTS) TABS Take 1 tablet by mouth at bedtime.    . simvastatin (ZOCOR) 10 MG tablet Take 1 tablet (10 mg total) by mouth at bedtime. 90 tablet 4   No current facility-administered medications on file prior to visit.   Allergies  Allergen Reactions  . Penicillins Rash   Family history: - See history of present illness, also: - HTN and thyroid disease in grandfather  PE: BP 138/90   Pulse 94   Ht 5\' 11"  (1.803 m)   Wt 245 lb 3.2 oz (111.2 kg)   SpO2 97%   BMI 34.20 kg/m  Wt Readings from Last 3 Encounters:  01/05/20 245 lb 3.2 oz (111.2 kg)  08/19/17 248 lb 3.2 oz (112.6 kg)  07/02/17 255 lb (115.7 kg)   Constitutional: overweight, in NAD Eyes: PERRLA, EOMI, no exophthalmos ENT: moist mucous membranes, no thyromegaly, no cervical lymphadenopathy Cardiovascular: RRR, No MRG, + R lower leg slightly edematous Respiratory: CTA B Gastrointestinal: abdomen soft, NT, ND,  BS+ Musculoskeletal: no deformities, strength intact in all 4 Skin: moist, warm, no rashes, + scraped skin above R ankle - medial -without signs of infection Neurological: no tremor with outstretched hands, DTR normal in all 4  ASSESSMENT: 1. DM2, non-insulin-dependent, uncontrolled, with complications - PN  2. HL  3.  Obesity class I  PLAN:  1. Patient with longstanding, previously uncontrolled type 2 diabetes, returning after another long absence.  At today's visit, sugars are out of slightly above target, but he mentions that sugars before meals are higher than target usually as he is checking them after snacks or not long after the previous meal. -Since last visit, he stopped  07/04/17 (he cannot remember why), and switched from Januvia to low-dose Trulicity.  He tolerates this well.  He also continues Metformin. -At today's visit, we discussed to try to come to our visit every 6 months and to  continue to check his blood sugars once a day rotating check times. - we checked his HbA1c: 5.5% (lower than expected).  We discussed about possible causes for HbA1c levels that are lower than expected and the fact the usually, in these cases, we need to go by blood sugars at home.  He does not bring a blood sugar log, however, per his recall, and average blood sugar for him would be approximately 140, which corresponds to an HbA1c level of approximately 6.5%.  This is still at goal, so for now I would not suggest a change in regimen. - I suggested to:  Patient Instructions  Please continue: - Metformin ER 2000 mg with dinner. - Trulicity 0.75 mg weekly  Please call and schedule an eye appt with Dr. Randon Goldsmith: [facility]:  Dr. Jeni Salles MD ?  Address: [address] [place], [place], [place] [address]  [phone]  Please return in 4 months with your sugar log.   - advised to check sugars at different times of the day - 1x a day, rotating check times - advised for yearly  eye exams >> he is not UTD -again recommended to establish care with [facility] - will check annual labs today - She ran out of lisinopril and we discussed at this visit why he needs it.  We will check an ACR today.  Blood pressure is above target, so I would recommend to restart lisinopril after results are back. - return to clinic in 6 months  2. HL -Reviewed latest lipid panel from 2 years ago: LDL at goal, triglycerides high: Lab Results  Component Value Date   CHOL 140 08/19/2017   HDL 44.50 08/19/2017   LDLDIRECT 75.0 08/19/2017   TRIG 202.0 (H) 08/19/2017   CHOLHDL 3 08/19/2017  -Previously on simvastatin 10 w/o side effects, but ran out sometime ago -He is due for another lipid panel -we will most likely need to refill this after results are back.  3. Obesity class I -continue SGLT 2 inhibitor and GLP-1 receptor agonist which should also help with weight loss -Lost 3 pounds since last visit  Needs refills for Zocor and Lisinopril, test strips, Trulicity, Metformin.  Component     Latest Ref Rng & Units 01/05/2020  Sodium     135 - 145 mEq/L 138  Potassium     3.5 - 5.1 mEq/L 4.2  Chloride     96 - 112 mEq/L 100  CO2     19 - 32 mEq/L 28  Glucose     70 - 99 mg/dL 90  BUN     6 - 23 mg/dL 10  Creatinine     5.63 - 1.50 mg/dL 8.93  Total Bilirubin     0.2 - 1.2 mg/dL 1.6 (H)  Alkaline Phosphatase     39 - 117 U/L 60  AST     0 - 37 U/L 23  ALT     0 - 53 U/L 31  Total Protein     6.0 - 8.3 g/dL 7.5  Albumin     3.5 - 5.2 g/dL 4.4  GFR     >73.42 mL/min 104.29  Calcium     8.4 - 10.5 mg/dL 9.4  Cholesterol     0 - 200 mg/dL 876 (H)  Triglycerides     0 - 149 mg/dL 811.5 (H)  HDL Cholesterol     >39.00 mg/dL 72.62  VLDL     0.0 - 03.5 mg/dL  51.2 (H)  Total CHOL/HDL Ratio      5  NonHDL      173.19  Microalb, Ur     0.0 - 1.9 mg/dL 4.1 (H)  Creatinine,U     mg/dL 378.5  MICROALB/CREAT RATIO     0.0 - 30.0 mg/g 2.5   Hemoglobin A1C     4.0 - 5.6 % 5.5  Direct LDL     mg/dL 885.0   Lipid panel is worse-we will restart his statin.  Also, due to his higher blood pressure, will restart lisinopril.  Carlus Pavlov, MD PhD [facility]

## 2020-01-06 ENCOUNTER — Encounter: Payer: Self-pay | Admitting: Internal Medicine

## 2020-01-09 DIAGNOSIS — S9001XA Contusion of right ankle, initial encounter: Secondary | ICD-10-CM | POA: Diagnosis not present

## 2020-01-09 MED ORDER — TRULICITY 0.75 MG/0.5ML ~~LOC~~ SOAJ: SUBCUTANEOUS | 3 refills | Status: DC

## 2020-01-09 MED ORDER — SIMVASTATIN 10 MG PO TABS: 10.0000 mg | ORAL_TABLET | Freq: Every day | ORAL | 3 refills | Status: DC

## 2020-01-09 MED ORDER — LISINOPRIL 5 MG PO TABS: ORAL_TABLET | ORAL | 3 refills | Status: DC

## 2020-01-09 MED ORDER — ONETOUCH VERIO VI STRP: ORAL_STRIP | 3 refills | Status: DC

## 2020-01-09 MED ORDER — METFORMIN HCL ER 500 MG PO TB24: ORAL_TABLET | ORAL | 3 refills | Status: DC

## 2020-01-12 DIAGNOSIS — M25571 Pain in right ankle and joints of right foot: Secondary | ICD-10-CM | POA: Diagnosis not present

## 2020-01-12 DIAGNOSIS — S93401A Sprain of unspecified ligament of right ankle, initial encounter: Secondary | ICD-10-CM | POA: Diagnosis not present

## 2020-01-26 DIAGNOSIS — S93401D Sprain of unspecified ligament of right ankle, subsequent encounter: Secondary | ICD-10-CM | POA: Diagnosis not present

## 2020-07-05 ENCOUNTER — Ambulatory Visit: Payer: BC Managed Care – PPO | Admitting: Internal Medicine

## 2020-08-30 ENCOUNTER — Ambulatory Visit: Payer: BC Managed Care – PPO | Admitting: Internal Medicine

## 2020-11-08 ENCOUNTER — Ambulatory Visit: Payer: BC Managed Care – PPO | Admitting: Internal Medicine

## 2020-11-22 ENCOUNTER — Other Ambulatory Visit: Payer: Self-pay | Admitting: Internal Medicine

## 2020-11-22 DIAGNOSIS — E1142 Type 2 diabetes mellitus with diabetic polyneuropathy: Secondary | ICD-10-CM

## 2020-11-27 ENCOUNTER — Ambulatory Visit: Payer: BC Managed Care – PPO | Admitting: Internal Medicine

## 2020-12-16 ENCOUNTER — Other Ambulatory Visit: Payer: Self-pay | Admitting: Internal Medicine

## 2020-12-16 DIAGNOSIS — E1142 Type 2 diabetes mellitus with diabetic polyneuropathy: Secondary | ICD-10-CM

## 2020-12-16 DIAGNOSIS — E785 Hyperlipidemia, unspecified: Secondary | ICD-10-CM

## 2020-12-26 ENCOUNTER — Ambulatory Visit (INDEPENDENT_AMBULATORY_CARE_PROVIDER_SITE_OTHER): Payer: BC Managed Care – PPO | Admitting: Internal Medicine

## 2020-12-26 VITALS — BP 118/80 | HR 88 | Ht 71.0 in | Wt 249.0 lb

## 2020-12-26 DIAGNOSIS — E1142 Type 2 diabetes mellitus with diabetic polyneuropathy: Secondary | ICD-10-CM | POA: Diagnosis not present

## 2020-12-26 DIAGNOSIS — Z23 Encounter for immunization: Secondary | ICD-10-CM

## 2020-12-26 DIAGNOSIS — E785 Hyperlipidemia, unspecified: Secondary | ICD-10-CM | POA: Diagnosis not present

## 2020-12-26 LAB — LIPID PANEL
Cholesterol: 142 mg/dL (ref 0–200)
HDL: 44.1 mg/dL (ref >39.00)
LDL Cholesterol: 67 mg/dL (ref 0–99)
NonHDL: 98.27
Total CHOL/HDL Ratio: 3
Triglycerides: 157 mg/dL — ABNORMAL HIGH (ref 0.0–149.0)
VLDL: 31.4 mg/dL (ref 0.0–40.0)

## 2020-12-26 LAB — MICROALBUMIN / CREATININE URINE RATIO
Creatinine,U: 129.6 mg/dL
Microalb Creat Ratio: 2.1 mg/g (ref 0.0–30.0)
Microalb, Ur: 2.8 mg/dL — ABNORMAL HIGH (ref 0.0–1.9)

## 2020-12-26 LAB — POCT GLYCOSYLATED HEMOGLOBIN (HGB A1C): Hemoglobin A1C: 6.2 % — AB (ref 4.0–5.6)

## 2020-12-26 MED ORDER — TRULICITY 0.75 MG/0.5ML ~~LOC~~ SOAJ: SUBCUTANEOUS | 3 refills | Status: DC

## 2020-12-26 MED ORDER — ONETOUCH VERIO VI STRP: ORAL_STRIP | 3 refills | Status: DC

## 2020-12-26 MED ORDER — ONETOUCH DELICA LANCETS 33G MISC: 3 refills | Status: AC

## 2020-12-26 MED ORDER — METFORMIN HCL ER 500 MG PO TB24: ORAL_TABLET | ORAL | 3 refills | Status: DC

## 2020-12-26 MED ORDER — LISINOPRIL 5 MG PO TABS: ORAL_TABLET | ORAL | 3 refills | Status: DC

## 2020-12-26 MED ORDER — SIMVASTATIN 10 MG PO TABS: 10.0000 mg | ORAL_TABLET | Freq: Every day | ORAL | 3 refills | Status: DC

## 2020-12-26 NOTE — Patient Instructions (Addendum)
Please continue: - Metformin ER 2000 mg with dinner. - Trulicity 0.75 mg weekly  Please stop at the lab.  Please return in 6 months with your sugar log.

## 2020-12-26 NOTE — Progress Notes (Signed)
Patient ID: Nicholas [name], male   DOB: [birth year], 50 y.o.   MRN: [id number]   This visit occurred during the SARS-CoV-2 public health emergency.  Safety protocols were in place, including screening questions prior to the visit, additional usage of staff PPE, and extensive cleaning of exam room while observing appropriate contact time as indicated for disinfecting solutions.   HPI: Nicholas [name] is a 50 y.o.-year-old male, presenting for follow-up for DM2, dx in ~2012, non-insulin-dependent, uncontrolled, with complications (PN).  Last visit 1 year ago.  Previous visit 1.5 years ago (virtual).  Previous visits were also spaced 1 year apart.  Interim history: At this visit, he found a new PCP and will schedule an appointment soon. He did schedule an appointment with Dr. Prudencio Burly (ophthalmology) -coming up soon. No increased urination, blurry vision, nausea, chest pain. He does have back pain, chronic.  Reviewed HbA1c levels: Lab Results  Component Value Date   HGBA1C 5.5 01/05/2020   HGBA1C 6.6 (H) 07/03/2017   HGBA1C 6.5 07/24/2016  He was on a Prednisone taper for 7 days for neck pain >> 01/2015.  Pt is on a regimen of: - Metformin ER 2000 mg with dinner - Januvia 100 mg before first meal of the day >> Trulicity 6.29 mg weekly - >> stopped...  Had diarrhea, some nausea - with regular Metformin, but not with the ER formulation He was on Glipizide before >> had to stop b/c hypoglycemia.  Pt checks his sugars once a day: - am:  135-140, 150 >> 136-171 >> 110-120, 148 >> 96-147, ave 122 - 2h after b'fast: n/c - before lunch: 140 >> 140-142 >> 110-130 >> 110-138, ave 125 - 2h after lunch: n/c >> 121 >> n/c - before dinner: 121-168 >> 130-150 (snack) >> 87-185, ave 130 - 2h after dinner:  165 >> 140-160, 170 >> n/c >> 140s-150s - bedtime: n/c - nighttime: n/c Lowest sugar was 100 >> 110 >> 87; he has hypoglycemia awareness in the 70s. Highest sugar was 179 >> 170s >>  213.  Glucometer: Verio IQ  Pt's meals are: - Breakfast: skips usually - Lunch: sandwich/salad - Dinner: varies - Snacks: rarely, 1-2  -No CKD, last BUN/creatinine:  Lab Results  Component Value Date   BUN 10 01/05/2020   BUN 12 07/03/2017   CREATININE 0.79 01/05/2020   CREATININE 0.76 07/03/2017  On lisinopril -restarted at last visit after he ran out.  -+ HL; last set of lipids: Lab Results  Component Value Date   CHOL 217 (H) 01/05/2020   HDL 43.50 01/05/2020   LDLDIRECT 138.0 01/05/2020   TRIG 256.0 (H) 01/05/2020   CHOLHDL 5 01/05/2020  On Zocor 10 mg daily -restarted at last visit after he ran out.  - last eye exam was in 2014: No DR. Appointment coming up with Dr. Prudencio Burly.  -he has Numbness in his feet.  On ASA 81.  ROS: + See HPI Neurological: no tremors/+ numbness/no tingling/no dizziness  I reviewed pt's medications, allergies, PMH, social hx, family hx, and changes were documented in the history of present illness. Otherwise, unchanged from my initial visit note.  Patient Active Problem List   Diagnosis Date Noted   Type 2 diabetes mellitus with peripheral neuropathy ([place]) 06/05/2010    Priority: 1.   Cervical vertebral fracture ([place]) 07/03/2017   Essential hypertension 07/03/2017   Leukocytosis 07/03/2017   Accidental fall on or from stairs or steps 07/03/2017   C7 cervical fracture ([place]) 07/03/2017   Left shoulder  pain 12/19/2014   Hyperlipidemia 03/06/2008   PREMATURE VENTRICULAR CONTRACTIONS 11/07/2007   Past surgical history: - Appendectomy in 1982   Social History   Social History   Marital status: Married    Spouse name: N/A   Number of children: 1- Stepson    Occupational History   Scientist    Social History Main Topics   Smoking status: Former Smoker    Quit date: 03/10/2007   Smokeless tobacco: Never Used   Alcohol use No, 1 or 2 drinks per year    Drug use: No   Current Outpatient Medications on File Prior to Visit   Medication Sig Dispense Refill   glucose blood (ONETOUCH VERIO) test strip Use once daily for glucose control, dx 250.00 100 each 3   Lancets (ONETOUCH ULTRASOFT) lancets Use once daily for glucose control. Dx 250.00 100 each 12   lisinopril (ZESTRIL) 5 MG tablet TAKE 1 TABLET DAILY 10 tablet 0   MAGNESIUM PO Take 1 capsule by mouth 2 (two) times daily.     metFORMIN (GLUCOPHAGE-XR) 500 MG 24 hr tablet TAKE 4 TABLETS (2000 MG) WITH DINNER 360 tablet 3   Multiple Vitamins-Minerals (MULTIVITAMIN ADULTS) TABS Take 1 tablet by mouth at bedtime.     simvastatin (ZOCOR) 10 MG tablet TAKE 1 TABLET AT BEDTIME 10 tablet 0   TRULICITY 6.06 TK/1.6WF SOPN INJECT ONCE A WEEK 2 mL 0   No current facility-administered medications on file prior to visit.   Allergies  Allergen Reactions   Penicillins Rash   Family history: - See history of present illness, also: - HTN and thyroid disease in grandfather  PE: BP 118/80 (BP Location: Left Arm, Patient Position: Sitting, Cuff Size: Normal)   Pulse 88   Ht $R'5\' 11"'es$  (1.803 m)   Wt 249 lb (112.9 kg)   SpO2 98%   BMI 34.73 kg/m  Wt Readings from Last 3 Encounters:  12/26/20 249 lb (112.9 kg)  01/05/20 245 lb 3.2 oz (111.2 kg)  08/19/17 248 lb 3.2 oz (112.6 kg)   Constitutional: overweight, in NAD Eyes: PERRLA, EOMI, no exophthalmos ENT: moist mucous membranes, no thyromegaly, no cervical lymphadenopathy Cardiovascular: RRR, No MRG, Respiratory: CTA B Gastrointestinal: abdomen soft, NT, ND, BS+ Musculoskeletal: no deformities, strength intact in all 4 Skin: moist, warm, no rashes Neurological: no tremor with outstretched hands, DTR normal in all 4  ASSESSMENT: 1. DM2, non-insulin-dependent, uncontrolled, with complications - PN  2. HL  3.  Obesity class I  PLAN:  1. Patient with longstanding, previously uncontrolled type 2 diabetes, returning after another long absence of 1 year.  Previous visit was also a year prior.  At this visit, we  again discussed that the visits should be closer together.  His sugars appeared to be fairly well controlled at previous visits.  HbA1c was 5.5%, excellent, but lower than expected at last visit.  At that time we discussed about possible causes for lower HbA1c levels then predicted.  I explained that in these cases, we usually go by blood sugars at home or fructosamine levels in the absence of a CGM. -At this visit, he continues on metformin and Trulicity low-dose. -At this visit, sugars are mostly at goal with very few hypoglycemic exceptions after dietary investigations.  He had 1 or 2 higher blood sugars in the 200s, but these are exceptions.  No lows. -For now, we discussed about continuing the same dose of metformin and Trulicity and I refilled his prescriptions.  In fact, I refilled  all of his diabetic, hypertensive, and hyperlipidemic prescriptions since he did not establish care with a primary care yet.  He will call and schedule an appointment after our discussion today. - I suggested to:  Patient Instructions  Please continue: - Metformin ER 2000 mg with dinner. - Trulicity 3.25 mg weekly  Please stop at the lab.  Please return in 6 months with your sugar log.   - we checked his HbA1c: 6.2% (higher) - advised to check sugars at different times of the day - 1x a day, rotating check times - advised for yearly eye exams >> he is not UTD - will check annual labs today - return to clinic in 6 months -I advised him that I would like to see him at 6 months, rather than a year, if possible.  2. HL -Reviewed latest lipid panel from a year ago: Triglycerides and LDL above goal: Lab Results  Component Value Date   CHOL 217 (H) 01/05/2020   HDL 43.50 01/05/2020   LDLDIRECT 138.0 01/05/2020   TRIG 256.0 (H) 01/05/2020   CHOLHDL 5 01/05/2020  -On simvastatin 10 mg daily without side effects -We will check a lipid panel today  3. Obesity class I -continue SGLT 2 inhibitor and GLP-1  receptor agonist which should also help with weight loss -He gained 4 pounds since last visit  + flu shot today Component     Latest Ref Rng & Units 12/26/2020  Glucose     65 - 99 mg/dL 88  BUN     7 - 25 mg/dL 11  Creatinine     0.70 - 1.30 mg/dL 0.82  eGFR     > OR = 60 mL/min/1.60m2 107  BUN/Creatinine Ratio     6 - 22 (calc) NOT APPLICABLE  Sodium     498 - 146 mmol/L 139  Potassium     3.5 - 5.3 mmol/L 4.7  Chloride     98 - 110 mmol/L 102  CO2     20 - 32 mmol/L 26  Calcium     8.6 - 10.3 mg/dL 9.5  Total Protein     6.1 - 8.1 g/dL 7.4  Albumin MSPROF     3.6 - 5.1 g/dL 4.4  Globulin     1.9 - 3.7 g/dL (calc) 3.0  AG Ratio     1.0 - 2.5 (calc) 1.5  Total Bilirubin     0.2 - 1.2 mg/dL 1.2  Alkaline phosphatase (APISO)     35 - 144 U/L 60  AST     10 - 35 U/L 32  ALT     9 - 46 U/L 46  Cholesterol     0 - 200 mg/dL 142  Triglycerides     0.0 - 149.0 mg/dL 157.0 (H)  HDL Cholesterol     >39.00 mg/dL 44.10  VLDL     0.0 - 40.0 mg/dL 31.4  LDL (calc)     0 - 99 mg/dL 67  Total CHOL/HDL Ratio      3  NonHDL      98.27  Microalb, Ur     0.0 - 1.9 mg/dL 2.8 (H)  Creatinine,U     mg/dL 129.6  MICROALB/CREAT RATIO     0.0 - 30.0 mg/g 2.1  Labs are normal with the exception of a slightly high triglyceride level.  Philemon Kingdom, MD PhD [facility]

## 2020-12-27 ENCOUNTER — Encounter: Payer: Self-pay | Admitting: Internal Medicine

## 2020-12-27 LAB — COMPLETE METABOLIC PANEL WITH GFR
AG Ratio: 1.5 (calc) (ref 1.0–2.5)
ALT: 46 U/L (ref 9–46)
AST: 32 U/L (ref 10–35)
Albumin: 4.4 g/dL (ref 3.6–5.1)
Alkaline phosphatase (APISO): 60 U/L (ref 35–144)
BUN: 11 mg/dL (ref 7–25)
CO2: 26 mmol/L (ref 20–32)
Calcium: 9.5 mg/dL (ref 8.6–10.3)
Chloride: 102 mmol/L (ref 98–110)
Creat: 0.82 mg/dL (ref 0.70–1.30)
Globulin: 3 g/dL (calc) (ref 1.9–3.7)
Glucose, Bld: 88 mg/dL (ref 65–99)
Potassium: 4.7 mmol/L (ref 3.5–5.3)
Sodium: 139 mmol/L (ref 135–146)
Total Bilirubin: 1.2 mg/dL (ref 0.2–1.2)
Total Protein: 7.4 g/dL (ref 6.1–8.1)
eGFR: 107 mL/min/{1.73_m2} (ref >=60)

## 2021-06-12 ENCOUNTER — Telehealth: Payer: Self-pay | Admitting: Pharmacy Technician

## 2021-06-12 ENCOUNTER — Other Ambulatory Visit (HOSPITAL_COMMUNITY): Payer: Self-pay

## 2021-06-12 NOTE — Telephone Encounter (Signed)
Patient Advocate Encounter ? ?Received notification from [employer] regarding a prior authorization for TRULICITY 0.75MG . Authorization has been APPROVED from 3.7.23 to 4.5.24.  ? ?Per test claim, copay for 84 days supply is $41.35 w/eVoucher $450 towards your copay. ?Mail Order ? ? ?Authorization # [id number] ? ? ? ?Received notification from [employer] that prior authorization for TRULICITY 0.75MG  is required. ?  ?PA submitted on 4.6.23 ?Key [id number] ?Status is pending ?  ?[facility] will continue to follow ? ?Nicholas [name], CPhT ?Patient Advocate ?[facility] ?Phone: [phone] ?Fax:  [phone] ? ?

## 2021-06-26 ENCOUNTER — Encounter: Payer: Self-pay | Admitting: Internal Medicine

## 2021-06-26 ENCOUNTER — Ambulatory Visit (INDEPENDENT_AMBULATORY_CARE_PROVIDER_SITE_OTHER): Payer: BC Managed Care – PPO | Admitting: Internal Medicine

## 2021-06-26 VITALS — BP 118/82 | HR 101 | Ht 71.0 in | Wt 253.2 lb

## 2021-06-26 DIAGNOSIS — E785 Hyperlipidemia, unspecified: Secondary | ICD-10-CM | POA: Diagnosis not present

## 2021-06-26 DIAGNOSIS — E1142 Type 2 diabetes mellitus with diabetic polyneuropathy: Secondary | ICD-10-CM

## 2021-06-26 LAB — POCT GLYCOSYLATED HEMOGLOBIN (HGB A1C): Hemoglobin A1C: 6.6 % — AB (ref 4.0–5.6)

## 2021-06-26 NOTE — Progress Notes (Signed)
Patient ID: Nicholas [name], male   DOB: 09/12/1970, 51 y.o.   MRN: [id number]  ? ?This visit occurred during the SARS-CoV-2 public health emergency.  Safety protocols were in place, including screening questions prior to the visit, additional usage of staff PPE, and extensive cleaning of exam room while observing appropriate contact time as indicated for disinfecting solutions.  ? ?HPI: ?Nicholas [name] is a 51 y.o.-year-old male, presenting for follow-up for DM2, dx in ~2012, non-insulin-dependent, uncontrolled, with complications (PN).  Last visit 6 months ago. ? ?Interim history: ?No increased urination, blurry vision, nausea, chest pain. ?He continues to have back pain, chronic. ? ?Reviewed HbA1c levels: ?Lab Results  ?Component Value Date  ? HGBA1C 6.2 (A) 12/26/2020  ? HGBA1C 5.5 01/05/2020  ? HGBA1C 6.6 (H) 07/03/2017  ?He was on a Prednisone taper for 7 days for neck pain >> 01/2015. ? ?Pt is on a regimen of: ?- Metformin ER 2000 mg with dinner ?- >> Trulicity 0.75 mg weekly ?- >> stopped...  ?Had diarrhea, some nausea - with regular Metformin, but not with the ER formulation ?He was on Glipizide before >> had to stop b/c hypoglycemia. ? ?Pt checks his sugars once a day: ?- am:  136-171 >> 110-120, 148 >> 96-147, ave 122 >> 101-149, ave 120 ?- 2h after b'fast: n/c ?- before lunch:  140-142 >> 110-130 >> 110-138, ave 125 >> 100-130 ?- 2h after lunch: n/c >> 121 >> n/c ?- before dinner: 121-168 >> 130-150 (snack) >> 87-185, ave 130 >> 110-130 ?- 2h after dinner:  165 >> 140-160, 170 >> n/c >> 140s-150s >> up to 180 ?- bedtime: n/c ?- nighttime: n/c ?Lowest sugar was 100 >> 110 >> 87 >> 101; he has hypoglycemia awareness in the 70s. ?Highest sugar was 179 >> 170s >> 213 >> 180. ? ?Glucometer: Verio IQ ? ?Pt's meals are: ?- Breakfast: skips usually ?- Lunch: sandwich/salad ?- Dinner: varies ?- Snacks: rarely, 1-2 ? ?-No CKD, last BUN/creatinine:  ?Lab Results  ?Component Value Date  ? BUN 11 12/26/2020  ? BUN  10 01/05/2020  ? CREATININE 0.82 12/26/2020  ? CREATININE 0.79 01/05/2020  ?On lisinopril. ? ?-+ HL; last set of lipids: ?Lab Results  ?Component Value Date  ? CHOL 142 12/26/2020  ? HDL 44.10 12/26/2020  ? LDLCALC 67 12/26/2020  ? LDLDIRECT 138.0 01/05/2020  ? TRIG 157.0 (H) 12/26/2020  ? CHOLHDL 3 12/26/2020  ?On Zocor 10 mg daily. ? ?- last eye exam was in 2014: No DR. Appointment coming up with Dr. Randon Goldsmith in 08/2021.  He had to cancel the last appointment as he was out of town. ? ?- he has numbness in his feet. ? ?On ASA 81. ? ?ROS: ?+ See HPI ?Neurological: no tremors/+ numbness/no tingling/no dizziness ? ?I reviewed pt's medications, allergies, PMH, social hx, family hx, and changes were documented in the history of present illness. Otherwise, unchanged from my initial visit note. ? ?Patient Active Problem List  ? Diagnosis Date Noted  ? Type 2 diabetes mellitus with peripheral neuropathy (HCC) 06/05/2010  ?  Priority: High  ? Cervical vertebral fracture (HCC) 07/03/2017  ? Essential hypertension 07/03/2017  ? Leukocytosis 07/03/2017  ? Accidental fall on or from stairs or steps 07/03/2017  ? C7 cervical fracture (HCC) 07/03/2017  ? Left shoulder pain 12/19/2014  ? Hyperlipidemia 03/06/2008  ? PREMATURE VENTRICULAR CONTRACTIONS 11/07/2007  ? ?Past surgical history: ?- Appendectomy in 1982  ? ?Social History  ? ?Social History  ? Marital  status: Married  ?  Spouse name: N/A  ? Number of children: 1- Ste[place]   ? 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 ?Social History Main Topics  ? Smoking status: Former Smoker  ?  Quit date: 03/10/2007  ? Smokeless tobacco: Never Used  ? Alcohol use No, 1 or 2 drinks per year   ? Drug use: No  ? ?Current Outpatient Medications on File Prior to Visit  ?Medication Sig Dispense Refill  ? Dulaglutide (TRULICITY) 0.75 MG/0.5ML SOPN Inject 0.75 mg into the skin weekly 6 mL 3  ? glucose blood (ONETOUCH VERIO) test strip Use once daily for glucose control, dx 250.00 100 each 3  ?  lisinopril (ZESTRIL) 5 MG tablet TAKE 1 TABLET DAILY 90 tablet 3  ? MAGNESIUM PO Take 1 capsule by mouth 2 (two) times daily.    ? metFORMIN (GLUCOPHAGE-XR) 500 MG 24 hr tablet TAKE 4 TABLETS (2000 MG) WITH DINNER 360 tablet 3  ? Multiple Vitamins-Minerals (MULTIVITAMIN ADULTS) TABS Take 1 tablet by mouth at bedtime.    ? OneTouch Delica Lancets 33G MISC Check 1x a day as advised 100 each 3  ? simvastatin (ZOCOR) 10 MG tablet Take 1 tablet (10 mg total) by mouth at bedtime. 90 tablet 3  ? ?No current facility-administered medications on file prior to visit.  ? ?Allergies  ?Allergen Reactions  ? Penicillins Rash  ? ?Family history: ?- See history of present illness, also: ?- HTN and thyroid disease in grandfather ? ?PE: ?BP 118/82 (BP Location: Left Arm, Patient Position: Sitting, Cuff Size: Normal)   Pulse (!) 101   Ht 5\' 11"  (1.803 m)   Wt 253 lb 3.2 oz (114.9 kg)   SpO2 94%   BMI 35.31 kg/m?  ?Wt Readings from Last 3 Encounters:  ?06/26/21 253 lb 3.2 oz (114.9 kg)  ?12/26/20 249 lb (112.9 kg)  ?01/05/20 245 lb 3.2 oz (111.2 kg)  ? ?Constitutional: overweight, in NAD ?Eyes: PERRLA, EOMI, no exophthalmos ?ENT: moist mucous membranes, no thyromegaly, no cervical lymphadenopathy ?Cardiovascular: Tachycardia, RR, No MRG, ?Respiratory: CTA B ?Musculoskeletal: no deformities, strength intact in all 4 ?Skin: moist, warm, no rashes ?Neurological: + Very faint tremor with outstretched hands, DTR normal in all 4 ?Diabetic Foot Exam - Simple   ?Simple Foot Form ?Diabetic Foot exam was performed with the following findings: Yes 06/26/2021  1:20 PM  ?Visual Inspection ?No deformities, no ulcerations, no other skin breakdown bilaterally: Yes ?Sensation Testing ?See comments: Yes ?Pulse Check ?Posterior Tibialis and Dorsalis pulse intact bilaterally: Yes ?Comments ?Decreased sensation to monofilament on B halluces ?  ? ? ?ASSESSMENT: ?1. DM2, non-insulin-dependent, uncontrolled, with complications ?- PN ? ?2. HL ? ?3.   Obesity class I ? ?PLAN:  ?1. Patient with longstanding, previously uncontrolled type 2 diabetes, with good control (latest HbA1c obtained at last visit was slightly higher, at 6.2%).  He continues on metformin and weekly GLP-1 receptor agonist.  At last visit, sugars were mostly at goal with few hyperglycemic exceptions after dietary indiscretions.  He had only 1 or 2 higher blood sugars in the 200s.  We did not change his regimen at that time.  I refilled all his diabetic, hypertensive, and hyperlipidemic prescriptions since he did not establish care with a primary care yet at that time.  I strongly advised him to do so. ?-A PA form for Trulicity was approved 2 weeks ago. ?-At today's visit, sugars remain fairly well controlled, but he does mention that he had more blood  sugars above target especially when he was traveling.  He is not going to be traveling during the summer.  For now, since the vast majority of his blood sugars are at goal, I did not suggest any changes in his regimen.  However, we decided that if his blood sugars continue to increase and has more hypoglycemic spikes, at next visit, we can increase his Trulicity dose, before the holidays. ?- I suggested to:  ?Patient Instructions  ?Please continue: ?- Metformin ER 2000 mg with dinner. ?- Trulicity 0.75 mg weekly ? ?Please return in 6 months with your sugar log.  ? ?- we checked his HbA1c: 6.6% (slightly higher) ?- advised to check sugars at different times of the day - 1x a day, rotating check times ?- advised for yearly eye exams >> he is still not UTD.  Has an appointment coming up in June. ?- return to clinic in 6 months  ? ?2. HL ?-Reviewed latest lipid panel from last visit: LDL at goal, triglycerides only slightly high, ?Lab Results  ?Component Value Date  ? CHOL 142 12/26/2020  ? HDL 44.10 12/26/2020  ? LDLCALC 67 12/26/2020  ? LDLDIRECT 138.0 01/05/2020  ? TRIG 157.0 (H) 12/26/2020  ? CHOLHDL 3 12/26/2020  ?-He is on simvastatin 10 mg  daily, with no side effects ? ?3. Obesity class I ?-continue GLP-1 receptor agonist which should also help with weight loss ?-He gained 4 pounds before last visit and 4 more since then ? ?Carlus Pavlov, MD PhD ?[facility]

## 2021-06-26 NOTE — Patient Instructions (Signed)
Please continue: - Metformin ER 2000 mg with dinner. - Trulicity 0.75 mg weekly  Please return in 6 months with your sugar log.  

## 2021-08-25 DIAGNOSIS — H52203 Unspecified astigmatism, bilateral: Secondary | ICD-10-CM | POA: Diagnosis not present

## 2021-08-25 DIAGNOSIS — E119 Type 2 diabetes mellitus without complications: Secondary | ICD-10-CM | POA: Diagnosis not present

## 2021-12-01 ENCOUNTER — Other Ambulatory Visit: Payer: Self-pay | Admitting: Internal Medicine

## 2021-12-01 DIAGNOSIS — E1142 Type 2 diabetes mellitus with diabetic polyneuropathy: Secondary | ICD-10-CM

## 2021-12-17 ENCOUNTER — Other Ambulatory Visit: Payer: Self-pay | Admitting: Internal Medicine

## 2021-12-17 DIAGNOSIS — E785 Hyperlipidemia, unspecified: Secondary | ICD-10-CM

## 2021-12-17 DIAGNOSIS — E1142 Type 2 diabetes mellitus with diabetic polyneuropathy: Secondary | ICD-10-CM

## 2021-12-22 ENCOUNTER — Other Ambulatory Visit: Payer: Self-pay | Admitting: Internal Medicine

## 2021-12-22 DIAGNOSIS — E1142 Type 2 diabetes mellitus with diabetic polyneuropathy: Secondary | ICD-10-CM

## 2021-12-25 ENCOUNTER — Ambulatory Visit: Payer: BC Managed Care – PPO | Admitting: Internal Medicine

## 2022-01-09 DIAGNOSIS — M10071 Idiopathic gout, right ankle and foot: Secondary | ICD-10-CM | POA: Diagnosis not present

## 2022-02-18 ENCOUNTER — Telehealth: Payer: Self-pay

## 2022-02-18 NOTE — Patient Outreach (Signed)
  Care Coordination   02/18/2022 Name: Nicholas [name] MRN: [id number] DOB: 09-Jul-1970   Care Coordination Outreach Attempts:  An unsuccessful telephone outreach was attempted today to offer the patient information about available care coordination services as a benefit of their health plan.   Follow Up Plan:  Additional outreach attempts will be made to offer the patient care coordination information and services.   Encounter Outcome:  No Answer   Care Coordination Interventions:  No, not indicated    Dudley Major RN, BSN,CCM, CDE Care Management Coordinator [facility] [phone]

## 2022-02-18 NOTE — Progress Notes (Unsigned)
HPI: [name] is a 51 y.o. male with PMHx significant for DM II,peripheral neuropathy, PVC's,HLD,and HTN here today to establish care. Follows with endocrinologist regularly, Dr Nicholas [name].  HLD on Simvastatin 10 mg daily.  Lab Results  Component Value Date   CHOL 142 12/26/2020   HDL 44.10 12/26/2020   LDLCALC 67 12/26/2020   LDLDIRECT 138.0 01/05/2020   TRIG 157.0 (H) 12/26/2020   CHOLHDL 3 12/26/2020   HNT on Lisinopril 5 mg daily. Negative for severe/frequent headache, visual changes, chest pain,DOE, palpitation, focal weakness, or edema.  Lab Results  Component Value Date   CREATININE 0.82 12/26/2020   BUN 11 12/26/2020   NA 139 12/26/2020   K 4.7 12/26/2020   CL 102 12/26/2020   CO2 26 12/26/2020   He reports experiencing feet MTP and IP joint pain, intermittent for a few years. Hx of gout. He has had uric acid levels tested before,last one 4 years ago 7.7.  Some times he has experienced swelling, redness associated with pain, last episode in 12/2011 that responded partially to Prednisone and colchicine. He is concerned about pseudogout causing foot pain. He has not identified exacerbating/trigger factors.  He has had X ray done years ago and degenerative changes reported.  He has a history of smoking for 18 years, approximately a pack a day, but quit in 2009. He reports having a residual mild productive cough , specially in the morning. Negative for SOB or wheezing.  Mass on his upper back, which he has had for years. It is not causing pain, slow growth.  He has not had a CPE in years, has not had colon or prostate cancer screening. Nocturia x 1, stable for years.  Review of Systems  Constitutional:  Negative for activity change, appetite change and fever.  HENT:  Negative for nosebleeds and sore throat.   Gastrointestinal:  Negative for abdominal pain, nausea and vomiting.  Endocrine: Negative for cold intolerance and heat intolerance.  Genitourinary:   Negative for decreased urine volume, dysuria and hematuria.  Musculoskeletal:  Positive for arthralgias.  Neurological:  Positive for numbness (feet, chronic.). Negative for syncope, facial asymmetry and weakness.  See other pertinent positives and negatives in HPI.  Current Outpatient Medications on File Prior to Visit  Medication Sig Dispense Refill   Dulaglutide (TRULICITY) A999333 [id number] SOPN INJECT 0.75 MG UNDER THE SKIN WEEKLY 6 mL 3   glucose blood (ONETOUCH VERIO) test strip Use once daily for glucose control, dx 250.00 100 each 3   lisinopril (ZESTRIL) 5 MG tablet TAKE 1 TABLET DAILY (ENOUGH MEDICATION UNTIL APPOINTMENT) 90 tablet 0   MAGNESIUM PO Take 1 capsule by mouth 2 (two) times daily.     metFORMIN (GLUCOPHAGE-XR) 500 MG 24 hr tablet TAKE 4 TABLETS WITH DINNER 360 tablet 3   Multiple Vitamins-Minerals (MULTIVITAMIN ADULTS) TABS Take 1 tablet by mouth at bedtime.     OneTouch Delica Lancets 99991111 MISC Check 1x a day as advised 100 each 3   simvastatin (ZOCOR) 10 MG tablet TAKE 1 TABLET AT BEDTIME 90 tablet 0   No current facility-administered medications on file prior to visit.   Past Medical History:  Diagnosis Date   Diabetes mellitus    Essential hypertension 07/03/2017   Allergies  Allergen Reactions   Iodine     Other reaction(s): Eye Redness   Penicillins Rash   History reviewed. No pertinent family history.  Social History   Socioeconomic History   Marital status: Married    Spouse  name: Not on file   Number of children: Not on file   Years of education: Not on file   Highest education level: Not on file  Occupational History   Not on file  Tobacco Use   Smoking status: Former    Packs/day: 1.00    Years: 18.00    Total pack years: 18.00    Types: Cigarettes    Quit date: 11/08/2007    Years since quitting: 14.2   Smokeless tobacco: Never  Substance and Sexual Activity   Alcohol use: No   Drug use: No   Sexual activity: Not on file  Other Topics  Concern   Not on file  Social History Narrative   Not on file   Social Determinants of Health   Financial Resource Strain: Not on file  Food Insecurity: Not on file  Transportation Needs: Not on file  Physical Activity: Not on file  Stress: Not on file  Social Connections: Not on file   Vitals:   02/20/22 0820  BP: 128/80  Pulse: 97  Resp: 16  SpO2: 97%  Body mass index is 35.15 kg/m.  Physical Exam Vitals and nursing note reviewed.  Constitutional:      General: He is not in acute distress.    Appearance: He is well-developed.  HENT:     Head: Normocephalic and atraumatic.     Mouth/Throat:     Mouth: Mucous membranes are moist.     Pharynx: Oropharynx is clear.  Eyes:     Conjunctiva/sclera: Conjunctivae normal.  Cardiovascular:     Rate and Rhythm: Normal rate and regular rhythm.     Pulses:          Dorsalis pedis pulses are 2+ on the right side and 2+ on the left side.     Heart sounds: No murmur heard. Pulmonary:     Effort: Pulmonary effort is normal. No respiratory distress.     Breath sounds: Normal breath sounds.  Abdominal:     Palpations: Abdomen is soft. There is no hepatomegaly or mass.     Tenderness: There is no abdominal tenderness.  Musculoskeletal:       Back:     Comments: Bunions and hammer toes. No signs of synovitis.  Lymphadenopathy:     Cervical: No cervical adenopathy.  Skin:    General: Skin is warm.     Findings: No erythema or rash.  Neurological:     Mental Status: He is alert and oriented to person, place, and time.     Cranial Nerves: No cranial nerve deficit.     Gait: Gait normal.  Psychiatric:     Comments: Well groomed, good eye contact.   ASSESSMENT AND PLAN:  [name] was seen today for establish care.  Diagnoses and all orders for this visit: Lab Results  Component Value Date   CREATININE 0.84 02/20/2022   BUN 13 02/20/2022   NA 140 02/20/2022   K 4.5 02/20/2022   CL 100 02/20/2022   CO2 29 02/20/2022    Lab Results  Component Value Date   CHOL 164 02/20/2022   HDL 46.90 02/20/2022   LDLCALC 67 12/26/2020   LDLDIRECT 92.0 02/20/2022   TRIG 204.0 (H) 02/20/2022   CHOLHDL 3 02/20/2022   Lab Results  Component Value Date   MICROALBUR 2.0 (H) 02/20/2022   MICROALBUR 2.8 (H) 12/26/2020   Lab Results  Component Value Date   LABURIC 9.0 (H) 02/20/2022   Chronic pain of toe, unspecified laterality  Assessment & Plan: We discussed possible causes, gout,neuropathy, and OA are some to consider. Uric acid 7.7 4 years ago. Recommend podiatrist evaluation. Further recommendations according to uric acid result.  Orders: -     Uric acid; Future  Hyperlipidemia, unspecified hyperlipidemia type Assessment & Plan: LDL at goal. Continue Simvastatin 10 mg daily. Low fat diet also recommended. FLP ordered today.  Orders: -     Lipid panel; Future  Encounter for HCV screening test for low risk patient -     Hepatitis C antibody; Future  Essential hypertension Assessment & Plan: BP adequately controled. Continue Lisinopril 5 mg daily and low salt diet. Eye exam is current.  Orders: -     Comprehensive metabolic panel; Future  Type 2 diabetes mellitus with peripheral neuropathy [facility]) Assessment & Plan: HgA1C at goal, 6.3 in 02/2022. Following with Dr Elvera Lennox.  Orders: -     Comprehensive metabolic panel; Future -     Microalbumin / creatinine urine ratio; Future  Sebaceous cyst Assessment & Plan: We discussed Dx, prognosis,and treatment options. He is asymptomatic and does not wish to pursue surgical removal at this time. Continue monitoring for changes.   Prostate cancer screening -     PSA; Future  Colon cancer screening -     Cologuard  Pain in joints of both feet Assessment & Plan: We discussed possible causes, gout,neuropathy, and OA are some to consider. Uric acid 7.7 4 years ago. Recommend podiatrist evaluation. Further recommendations according to uric  acid result.   Other orders -     LDL cholesterol, direct   Return in about 1 year (around 02/21/2023) for CPE, chronic problems.  Jacey Eckerson G. [place], MD  [facility]. [place] office.

## 2022-02-19 ENCOUNTER — Encounter: Payer: Self-pay | Admitting: Internal Medicine

## 2022-02-19 ENCOUNTER — Ambulatory Visit (INDEPENDENT_AMBULATORY_CARE_PROVIDER_SITE_OTHER): Payer: BC Managed Care – PPO | Admitting: Internal Medicine

## 2022-02-19 VITALS — BP 130/82 | HR 72 | Ht 71.0 in | Wt 252.8 lb

## 2022-02-19 DIAGNOSIS — E785 Hyperlipidemia, unspecified: Secondary | ICD-10-CM | POA: Diagnosis not present

## 2022-02-19 DIAGNOSIS — E1142 Type 2 diabetes mellitus with diabetic polyneuropathy: Secondary | ICD-10-CM | POA: Diagnosis not present

## 2022-02-19 LAB — POCT GLYCOSYLATED HEMOGLOBIN (HGB A1C): Hemoglobin A1C: 6.3 % — AB (ref 4.0–5.6)

## 2022-02-19 MED ORDER — ONETOUCH VERIO VI STRP: ORAL_STRIP | 3 refills | Status: AC

## 2022-02-19 NOTE — Progress Notes (Signed)
Patient ID: Nicholas [name], male   DOB: [birth year], 51 y.o.   MRN: [id number]   HPI: Nicholas [name] is a 51 y.o.-year-old male, presenting for follow-up for DM2, dx in ~2012, non-insulin-dependent, uncontrolled, with complications (PN).  Last visit 8 months ago.  Interim history: No increased urination, blurry vision, nausea, chest pain. He continues to have back pain, chronic. Also foot pain - takes occasional Ibuprofen. He was recently on Prednisone (12/2021) for gout. Sugars were higher.  Reviewed HbA1c levels: Lab Results  Component Value Date   HGBA1C 6.6 (A) 06/26/2021   HGBA1C 6.2 (A) 12/26/2020   HGBA1C 5.5 01/05/2020  He was on a Prednisone taper for 7 days for neck pain >> 01/2015.  Pt is on a regimen of: - Metformin ER 2000 mg with dinner - >> Trulicity 0.75 mg weekly - >> stopped...  Had diarrhea, some nausea - with regular Metformin, but not with the ER formulation He was on Glipizide before >> had to stop b/c hypoglycemia.  Pt checks his sugars once a day: - am: 96-147, ave 122 >> 101-149, ave 120 >> 103-141 (ave 126) - 2h after b'fast: n/c >> 110-140 (ave 123) - before lunch:  110-138, ave 125 >> 100-130 >> 98-128 (ave 116) - 2h after lunch: n/c >> 121 >> n/c >> 98-128 (ave 176) - before dinner: 87-185, ave 130 >> 110-130 >> 108-213 (ave 129) - 2h after dinner: 140s-150s >> up to 180 >> 113-269 (ave 158) - bedtime: n/c - nighttime: n/c Lowest sugar was 100 >> 110 >> 87 >> 101 >> 98; he has hypoglycemia awareness in the 70s. Highest sugar was 179 >> 170s >> 213 >> 180 >> 269.  Glucometer: Verio IQ  Pt's meals are: - Breakfast: skips usually - Lunch: sandwich/salad - Dinner: varies - Snacks: rarely, 1-2  -No CKD, last BUN/creatinine:  Lab Results  Component Value Date   BUN 11 12/26/2020   BUN 10 01/05/2020   CREATININE 0.82 12/26/2020   CREATININE 0.79 01/05/2020  On lisinopril.  -+ HL; last set of lipids: Lab Results  Component Value Date    CHOL 142 12/26/2020   HDL 44.10 12/26/2020   LDLCALC 67 12/26/2020   LDLDIRECT 138.0 01/05/2020   TRIG 157.0 (H) 12/26/2020   CHOLHDL 3 12/26/2020  On Zocor 10 mg daily.  - last eye exam was in 08/2021: No DR reportedly. Dr. Randon Goldsmith. He has some floaters.  - he has numbness in his feet.  Last foot exam 06/26/2021.  On ASA 81.  ROS: + See HPI Neurological: no tremors/+ numbness/no tingling/no dizziness  I reviewed pt's medications, allergies, PMH, social hx, family hx, and changes were documented in the history of present illness. Otherwise, unchanged from my initial visit note.  Patient Active Problem List   Diagnosis Date Noted   Type 2 diabetes mellitus with peripheral neuropathy (HCC) 06/05/2010    Priority: High   Cervical vertebral fracture (HCC) 07/03/2017   Essential hypertension 07/03/2017   Leukocytosis 07/03/2017   Accidental fall on or from stairs or steps 07/03/2017   C7 cervical fracture (HCC) 07/03/2017   Left shoulder pain 12/19/2014   Hyperlipidemia 03/06/2008   PREMATURE VENTRICULAR CONTRACTIONS 11/07/2007   Past surgical history: - Appendectomy in 1982   Social History   Social History   Marital status: Married    Spouse name: N/A   Number of children: 1- Stepson    Occupational History   Scientist    Social History Main Topics  Smoking status: Former Smoker    Quit date: 03/10/2007   Smokeless tobacco: Never Used   Alcohol use No, 1 or 2 drinks per year    Drug use: No   Current Outpatient Medications on File Prior to Visit  Medication Sig Dispense Refill   Dulaglutide (TRULICITY) 0.75 MG/0.5ML SOPN INJECT 0.75 MG UNDER THE SKIN WEEKLY 6 mL 3   glucose blood (ONETOUCH VERIO) test strip Use once daily for glucose control, dx 250.00 100 each 3   lisinopril (ZESTRIL) 5 MG tablet TAKE 1 TABLET DAILY (ENOUGH MEDICATION UNTIL APPOINTMENT) 90 tablet 0   MAGNESIUM PO Take 1 capsule by mouth 2 (two) times daily.     metFORMIN (GLUCOPHAGE-XR) 500 MG 24  hr tablet TAKE 4 TABLETS WITH DINNER 360 tablet 3   Multiple Vitamins-Minerals (MULTIVITAMIN ADULTS) TABS Take 1 tablet by mouth at bedtime.     OneTouch Delica Lancets 33G MISC Check 1x a day as advised 100 each 3   simvastatin (ZOCOR) 10 MG tablet TAKE 1 TABLET AT BEDTIME 90 tablet 0   No current facility-administered medications on file prior to visit.   Allergies  Allergen Reactions   Iodine     Other reaction(s): Eye Redness   Penicillins Rash   Family history: - See history of present illness, also: - HTN and thyroid disease in grandfather  PE: BP 130/82 (BP Location: Left Arm, Patient Position: Sitting, Cuff Size: Normal)   Pulse 72   Ht 5\' 11"  (1.803 m)   Wt 252 lb 12.8 oz (114.7 kg)   SpO2 99%   BMI 35.26 kg/m  Wt Readings from Last 3 Encounters:  02/19/22 252 lb 12.8 oz (114.7 kg)  06/26/21 253 lb 3.2 oz (114.9 kg)  12/26/20 249 lb (112.9 kg)   Constitutional: overweight, in NAD Eyes:  EOMI, no exophthalmos ENT: no neck masses, no cervical lymphadenopathy Cardiovascular: RRR, No MRG Respiratory: CTA B Musculoskeletal: no deformities Skin:no rashes Neurological: no tremor with outstretched hands  ASSESSMENT: 1. DM2, non-insulin-dependent, uncontrolled, with complications - PN  2. HL  3.  Obesity class I  PLAN:  1. Patient with longstanding, previously uncontrolled type 2 diabetes, with improved control lately, on a regimen of metformin extended release and low-dose GLP-1 receptor agonist.  At last visit, sugars were well-controlled but he did mention that he had more blood sugars above target especially when traveling.  Since the majority of his blood sugars were at goal when I last saw him, I did not suggest a change in regimen.  HbA1c at that time was slightly higher, at 6.6%. -At today's visit, sugars remain mostly at goal.  He did have some hyperglycemic spikes after dietary indiscretions, and steroids, but the vast majority of his blood sugars remain at  goal.   -Occasionally, his blood sugars in the morning are in the 140s.  We discussed about possible reasons for this.  I encouraged him to start some form of consistent exercise, which can help reducing his insulin resistance and subsequently hepatic gluconeogenesis overnight.  He is exercising some, but not consistently.  We discussed that the mix of cardio and strength exercises is best. - I suggested to:  Patient Instructions  Please continue: - Metformin ER 2000 mg with dinner - Trulicity 0.75 mg weekly  Please return in 6 months with your sugar log.   - we checked his HbA1c: 6.3% (improved) - advised to check sugars at different times of the day - 1x a day, rotating check times -  advised for yearly eye exams >> he is UTD - he is due for labs.  He has an appointment tomorrow with his new PCP, Dr. [place]. - return to clinic in 6 months  2. HL -Latest lipid panel was reviewed: LDL at goal, triglycerides slightly high, Lab Results  Component Value Date   CHOL 142 12/26/2020   HDL 44.10 12/26/2020   LDLCALC 67 12/26/2020   LDLDIRECT 138.0 01/05/2020   TRIG 157.0 (H) 12/26/2020   CHOLHDL 3 12/26/2020  -He is on simvastatin 10 mg daily.  He tolerates this well. -He is due for another lipid panel -if not checked tomorrow by PCP, I will check this at next visit  3. Obesity class I -Will continue Trulicity low-dose, which should also help with weight loss -He gained 8 pounds before the last 2 visits combined -lost 1 lb since last OV  Carlus Pavlov, MD PhD [facility] Endocrinology

## 2022-02-19 NOTE — Patient Instructions (Signed)
Please continue: - Metformin ER 2000 mg with dinner. - Trulicity 0.75 mg weekly  Please return in 6 months with your sugar log.

## 2022-02-20 ENCOUNTER — Encounter: Payer: Self-pay | Admitting: Family Medicine

## 2022-02-20 ENCOUNTER — Ambulatory Visit (INDEPENDENT_AMBULATORY_CARE_PROVIDER_SITE_OTHER): Payer: BC Managed Care – PPO | Admitting: Family Medicine

## 2022-02-20 VITALS — BP 128/80 | HR 97 | Resp 16 | Ht 71.0 in | Wt 252.0 lb

## 2022-02-20 DIAGNOSIS — Z1159 Encounter for screening for other viral diseases: Secondary | ICD-10-CM

## 2022-02-20 DIAGNOSIS — G8929 Other chronic pain: Secondary | ICD-10-CM | POA: Diagnosis not present

## 2022-02-20 DIAGNOSIS — E785 Hyperlipidemia, unspecified: Secondary | ICD-10-CM

## 2022-02-20 DIAGNOSIS — Z1211 Encounter for screening for malignant neoplasm of colon: Secondary | ICD-10-CM

## 2022-02-20 DIAGNOSIS — L723 Sebaceous cyst: Secondary | ICD-10-CM

## 2022-02-20 DIAGNOSIS — M25571 Pain in right ankle and joints of right foot: Secondary | ICD-10-CM

## 2022-02-20 DIAGNOSIS — M25572 Pain in left ankle and joints of left foot: Secondary | ICD-10-CM

## 2022-02-20 DIAGNOSIS — Z125 Encounter for screening for malignant neoplasm of prostate: Secondary | ICD-10-CM

## 2022-02-20 DIAGNOSIS — I10 Essential (primary) hypertension: Secondary | ICD-10-CM | POA: Diagnosis not present

## 2022-02-20 DIAGNOSIS — M25579 Pain in unspecified ankle and joints of unspecified foot: Secondary | ICD-10-CM | POA: Insufficient documentation

## 2022-02-20 DIAGNOSIS — M79676 Pain in unspecified toe(s): Secondary | ICD-10-CM | POA: Diagnosis not present

## 2022-02-20 DIAGNOSIS — E1142 Type 2 diabetes mellitus with diabetic polyneuropathy: Secondary | ICD-10-CM

## 2022-02-20 LAB — LIPID PANEL
Cholesterol: 164 mg/dL (ref 0–200)
HDL: 46.9 mg/dL (ref >39.00)
NonHDL: 116.65
Total CHOL/HDL Ratio: 3
Triglycerides: 204 mg/dL — ABNORMAL HIGH (ref 0.0–149.0)
VLDL: 40.8 mg/dL — ABNORMAL HIGH (ref 0.0–40.0)

## 2022-02-20 LAB — COMPREHENSIVE METABOLIC PANEL
ALT: 40 U/L (ref 0–53)
AST: 32 U/L (ref 0–37)
Albumin: 4.7 g/dL (ref 3.5–5.2)
Alkaline Phosphatase: 62 U/L (ref 39–117)
BUN: 13 mg/dL (ref 6–23)
CO2: 29 mEq/L (ref 19–32)
Calcium: 10.3 mg/dL (ref 8.4–10.5)
Chloride: 100 mEq/L (ref 96–112)
Creatinine, Ser: 0.84 mg/dL (ref 0.40–1.50)
GFR: 100.85 mL/min (ref >60.00)
Glucose, Bld: 127 mg/dL — ABNORMAL HIGH (ref 70–99)
Potassium: 4.5 mEq/L (ref 3.5–5.1)
Sodium: 140 mEq/L (ref 135–145)
Total Bilirubin: 1 mg/dL (ref 0.2–1.2)
Total Protein: 7.4 g/dL (ref 6.0–8.3)

## 2022-02-20 LAB — MICROALBUMIN / CREATININE URINE RATIO
Creatinine,U: 107.2 mg/dL
Microalb Creat Ratio: 1.9 mg/g (ref 0.0–30.0)
Microalb, Ur: 2 mg/dL — ABNORMAL HIGH (ref 0.0–1.9)

## 2022-02-20 LAB — PSA: PSA: 0.37 ng/mL (ref 0.10–4.00)

## 2022-02-20 LAB — LDL CHOLESTEROL, DIRECT: Direct LDL: 92 mg/dL

## 2022-02-20 LAB — URIC ACID: Uric Acid, Serum: 9 mg/dL — ABNORMAL HIGH (ref 4.0–7.8)

## 2022-02-20 NOTE — Patient Instructions (Addendum)
A few things to remember from today's visit:  Encounter for HCV screening test for low risk patient - Plan: Hepatitis C antibody  Essential hypertension - Plan: Comprehensive metabolic panel  Chronic pain of toe, unspecified laterality - Plan: Uric acid  Type 2 diabetes mellitus with peripheral neuropathy (HCC) - Plan: Comprehensive metabolic panel, Microalbumin / creatinine urine ratio  Hyperlipidemia, unspecified hyperlipidemia type - Plan: Lipid panel  Sebaceous cyst  Prostate cancer screening - Plan: PSA  Colon cancer screening - Plan: Cologuard  No changes today. If you decide to have cyst in your back recommend let us know.   If you need refills for medications you take chronically, please call your pharmacy. Do not use My Chart to request refills or for acute issues that need immediate attention. If you send a my chart message, it may take a few days to be addressed, specially if I am not in the office.  Please be sure medication list is accurate. If a new problem present, please set up appointment sooner than planned today.

## 2022-02-20 NOTE — Assessment & Plan Note (Signed)
LDL at goal. Continue Simvastatin 10 mg daily. Low fat diet also recommended. FLP ordered today.

## 2022-02-20 NOTE — Assessment & Plan Note (Signed)
BP adequately controled. Continue Lisinopril 5 mg daily and low salt diet. Eye exam is current.

## 2022-02-20 NOTE — Assessment & Plan Note (Signed)
HgA1C at goal. Following with Dr Elvera Lennox.

## 2022-02-21 MED ORDER — ALLOPURINOL 100 MG PO TABS: 100.0000 mg | ORAL_TABLET | Freq: Every day | ORAL | 1 refills | Status: DC

## 2022-02-21 NOTE — Assessment & Plan Note (Addendum)
We discussed Dx, prognosis,and treatment options. He is asymptomatic and does not wish to pursue surgical removal at this time. Continue monitoring for changes.

## 2022-02-21 NOTE — Assessment & Plan Note (Addendum)
We discussed possible causes, gout,neuropathy, and OA are some to consider. Uric acid 7.7 4 years ago. Recommend podiatrist evaluation. Further recommendations according to uric acid result.

## 2022-02-23 LAB — HEPATITIS C ANTIBODY: Hepatitis C Ab: NONREACTIVE

## 2022-02-24 ENCOUNTER — Other Ambulatory Visit: Payer: Self-pay

## 2022-02-24 MED ORDER — SIMVASTATIN 20 MG PO TABS: 20.0000 mg | ORAL_TABLET | Freq: Every day | ORAL | 3 refills | Status: DC

## 2022-02-25 ENCOUNTER — Telehealth: Payer: Self-pay

## 2022-02-25 NOTE — Patient Outreach (Signed)
  Care Coordination   02/25/2022 Name: ARNE SCHLENDER MRN: [id number] DOB: 1970-04-30   Care Coordination Outreach Attempts:  A second unsuccessful outreach was attempted today to offer the patient with information about available care coordination services as a benefit of their health plan.     Follow Up Plan:  Additional outreach attempts will be made to offer the patient care coordination information and services.   Encounter Outcome:  No Answer   Care Coordination Interventions:  No, not indicated    Dudley Major RN, BSN,CCM, CDE Care Management Coordinator [facility] Care Management [phone]

## 2022-02-25 NOTE — Patient Instructions (Signed)
Visit Information  Thank you for taking time to visit with me today. Please don't hesitate to contact me if I can be of assistance to you.   Following are the goals we discussed today:   Goals Addressed             This Visit's Progress    COMPLETED: Care Coordination Activities - no follow up required       Care Coordination Interventions: Advised patient to get 150 minutes of moderate exercise a week Provided education to patient re: care coordination services Assessed social determinant of health barriers           If you are experiencing a Mental Health or Behavioral Health Crisis or need someone to talk to, please call the Suicide and Crisis Lifeline: 988 call the [place] National Suicide Prevention Lifeline: (561)117-4731 or TTY: (604)509-3218 TTY 437-161-5687) to talk to a trained counselor call 1-800-273-TALK (toll free, 24 hour hotline) go to [facility] [address], [place] [phone]) call 911   Patient verbalizes understanding of instructions and care plan provided today and agrees to view in MyChart. Active MyChart status and patient understanding of how to access instructions and care plan via MyChart confirmed with patient.     No further follow up required:    Dudley Major RN, Maximiano Coss, CDE Care Management Coordinator [facility] Care Management [phone]

## 2022-02-25 NOTE — Patient Outreach (Signed)
  Care Coordination   Initial Visit Note   02/25/2022 Name: Nicholas [name] MRN: [id number] DOB: 1970/11/16  Nicholas [name] is a 51 y.o. year old male who sees [place], Timoteo Expose, MD for primary care. I spoke with  Nicholas [name] by phone today.  What matters to the patients health and wellness today?  No concerns today.  States he is keeping his diabetes under good control.    Goals Addressed             This Visit's Progress    COMPLETED: Care Coordination Activities - no follow up required       Care Coordination Interventions: Advised patient to get 150 minutes of moderate exercise a week Provided education to patient re: care coordination services Assessed social determinant of health barriers          SDOH assessments and interventions completed:  Yes  SDOH Interventions Today    Flowsheet Row Most Recent Value  SDOH Interventions   Food Insecurity Interventions Intervention Not Indicated  Housing Interventions Intervention Not Indicated  Transportation Interventions Intervention Not Indicated  Utilities Interventions Intervention Not Indicated        Care Coordination Interventions:  Yes, provided   Follow up plan: No further intervention required.   Encounter Outcome:  Pt. Visit Completed  Dudley Major RN, BSN,CCM, CDE Care Management Coordinator [facility] Care Management [phone]

## 2022-03-10 ENCOUNTER — Other Ambulatory Visit: Payer: Self-pay

## 2022-03-10 MED ORDER — SIMVASTATIN 20 MG PO TABS: 20.0000 mg | ORAL_TABLET | Freq: Every day | ORAL | 3 refills | Status: DC

## 2022-03-17 ENCOUNTER — Other Ambulatory Visit: Payer: Self-pay | Admitting: Internal Medicine

## 2022-03-17 DIAGNOSIS — E1142 Type 2 diabetes mellitus with diabetic polyneuropathy: Secondary | ICD-10-CM

## 2022-03-20 ENCOUNTER — Other Ambulatory Visit: Payer: Self-pay

## 2022-03-20 MED ORDER — ALLOPURINOL 100 MG PO TABS: 100.0000 mg | ORAL_TABLET | Freq: Every day | ORAL | 1 refills | Status: DC

## 2022-03-25 LAB — COLOGUARD

## 2022-04-20 DIAGNOSIS — Z1211 Encounter for screening for malignant neoplasm of colon: Secondary | ICD-10-CM | POA: Diagnosis not present

## 2022-05-01 LAB — COLOGUARD: COLOGUARD: NEGATIVE

## 2022-07-16 ENCOUNTER — Other Ambulatory Visit: Payer: Self-pay | Admitting: Internal Medicine

## 2022-07-16 DIAGNOSIS — E1142 Type 2 diabetes mellitus with diabetic polyneuropathy: Secondary | ICD-10-CM

## 2022-07-31 ENCOUNTER — Telehealth: Payer: Self-pay

## 2022-07-31 ENCOUNTER — Encounter: Payer: Self-pay | Admitting: Internal Medicine

## 2022-07-31 ENCOUNTER — Other Ambulatory Visit (HOSPITAL_COMMUNITY): Payer: Self-pay

## 2022-07-31 NOTE — Telephone Encounter (Signed)
Pharmacy Patient Advocate Encounter  Prior Authorization has been approved   Effective through 07/31/23

## 2022-07-31 NOTE — Telephone Encounter (Signed)
Trulicity needs PA

## 2022-07-31 NOTE — Telephone Encounter (Signed)
Patient Advocate Encounter   Received notification from pt msgs that prior authorization is required for Trulicity  Submitted: 07/31/22 Key [id number]  Status is pending

## 2022-08-04 ENCOUNTER — Encounter: Payer: Self-pay | Admitting: Family Medicine

## 2022-08-05 ENCOUNTER — Ambulatory Visit (INDEPENDENT_AMBULATORY_CARE_PROVIDER_SITE_OTHER): Payer: BC Managed Care – PPO | Admitting: Family Medicine

## 2022-08-05 VITALS — BP 120/80 | HR 100 | Temp 98.4°F | Resp 12 | Ht 71.0 in | Wt 253.1 lb

## 2022-08-05 DIAGNOSIS — E1142 Type 2 diabetes mellitus with diabetic polyneuropathy: Secondary | ICD-10-CM

## 2022-08-05 DIAGNOSIS — S20362A Insect bite (nonvenomous) of left front wall of thorax, initial encounter: Secondary | ICD-10-CM

## 2022-08-05 DIAGNOSIS — L723 Sebaceous cyst: Secondary | ICD-10-CM | POA: Diagnosis not present

## 2022-08-05 DIAGNOSIS — W57XXXA Bitten or stung by nonvenomous insect and other nonvenomous arthropods, initial encounter: Secondary | ICD-10-CM | POA: Diagnosis not present

## 2022-08-05 MED ORDER — DOXYCYCLINE HYCLATE 100 MG PO TABS
200.0000 mg | ORAL_TABLET | Freq: Once | ORAL | 0 refills | Status: AC
Start: 2022-08-05 — End: 2022-08-05

## 2022-08-05 NOTE — Progress Notes (Signed)
ACUTE VISIT Chief Complaint  Patient presents with   Insect Bite    Tick bite, found yesterday   HPI: [name] is a 52 y.o. male with PMHx significant for DM II, gout,and HTN here today after discovering a tick attached to left mid clavicle area. He is unsure how long the tick had been attached but suspects it could have been since Sunday. He noticed the tick was not engorged or embedded and managed to remove it himself using tweezers while he was at [facility]. He describes the tick as possibly a deer tick. Area has no significant pruritus or tenderness. Treated bite area with topical abx. Negative for unusual fatigue,headache, fever,chills,myalgias/arthralgias,headache,or tingling/numbness.  DM 2, follows with endocrinologist, he has not check BS recently.  Additionally, he mentions sebaceous cysts on his back, which we have addressed in the past and have been present for a couple of years, growing slowly. They do not cause him pain or discomfort. However, he expresses a desire to have it removed.  Review of Systems  Constitutional:  Negative for activity change and appetite change.  HENT:  Negative for mouth sores and sore throat.   Respiratory:  Negative for cough, shortness of breath and wheezing.   Gastrointestinal:  Negative for abdominal pain, nausea and vomiting.  Skin:  Negative for wound.  Neurological:  Negative for syncope and weakness.  See other pertinent positives and negatives in HPI.  Current Outpatient Medications on File Prior to Visit  Medication Sig Dispense Refill   allopurinol (ZYLOPRIM) 100 MG tablet Take 1 tablet (100 mg total) by mouth daily. 90 tablet 1   Dulaglutide (TRULICITY) 0.75 MG/0.5ML SOPN Inject once a week. 12 mL 0   glucose blood (ONETOUCH VERIO) test strip Use once daily for glucose control, dx 250.00 100 each 3   lisinopril (ZESTRIL) 5 MG tablet TAKE 1 TABLET DAILY (ENOUGH MEDICATION UNTIL APPOINTMENT) 90 tablet 3   MAGNESIUM PO Take 1  capsule by mouth 2 (two) times daily.     metFORMIN (GLUCOPHAGE-XR) 500 MG 24 hr tablet TAKE 4 TABLETS WITH DINNER 360 tablet 3   Multiple Vitamins-Minerals (MULTIVITAMIN ADULTS) TABS Take 1 tablet by mouth at bedtime.     OneTouch Delica Lancets 33G MISC Check 1x a day as advised 100 each 3   simvastatin (ZOCOR) 20 MG tablet Take 1 tablet (20 mg total) by mouth at bedtime. 90 tablet 3   No current facility-administered medications on file prior to visit.   Past Medical History:  Diagnosis Date   Diabetes mellitus    Essential hypertension 07/03/2017   Allergies  Allergen Reactions   Iodine     Other reaction(s): Eye Redness   Penicillins Rash   Social History   Socioeconomic History   Marital status: Married    Spouse name: Not on file   Number of children: Not on file   Years of education: Not on file   Highest education level: Doctorate  Occupational History   Not on file  Tobacco Use   Smoking status: Former    Packs/day: 1.00    Years: 18.00    Additional pack years: 0.00    Total pack years: 18.00    Types: Cigarettes    Quit date: 11/08/2007    Years since quitting: 14.7   Smokeless tobacco: Never  Substance and Sexual Activity   Alcohol use: No   Drug use: No   Sexual activity: Not on file  Other Topics Concern   Not on  file  Social History Narrative   Not on file   Social Determinants of Health   Financial Resource Strain: Patient Declined (08/05/2022)   Overall Financial Resource Strain (CARDIA)    Difficulty of Paying Living Expenses: Patient declined  Food Insecurity: Patient Declined (08/05/2022)   Hunger Vital Sign    Worried About Running Out of Food in the Last Year: Patient declined    Ran Out of Food in the Last Year: Patient declined  Transportation Needs: Patient Declined (08/05/2022)   PRAPARE - Administrator, Civil Service (Medical): Patient declined    Lack of Transportation (Non-Medical): Patient declined  Physical Activity:  Unknown (08/05/2022)   Exercise Vital Sign    Days of Exercise per Week: Patient declined    Minutes of Exercise per Session: Not on file  Stress: Patient Declined (08/05/2022)   [employer] of Occupational Health - Occupational Stress Questionnaire    Feeling of Stress : Patient declined  Social Connections: Unknown (08/05/2022)   Social Connection and Isolation Panel [NHANES]    Frequency of Communication with Friends and Family: Patient declined    Frequency of Social Gatherings with Friends and Family: Patient declined    Attends Religious Services: Patient declined    Database administrator or Organizations: Patient declined    Attends Banker Meetings: Not on file    Marital Status: Married   Vitals:   08/05/22 1515  BP: 120/80  Pulse: 100  Resp: 12  Temp: 98.4 F (36.9 C)  SpO2: 96%  Body mass index is 35.3 kg/m.  Physical Exam Vitals and nursing note reviewed.  Constitutional:      General: He is not in acute distress.    Appearance: He is well-developed.  HENT:     Head: Normocephalic and atraumatic.  Eyes:     Conjunctiva/sclera: Conjunctivae normal.  Cardiovascular:     Rate and Rhythm: Normal rate and regular rhythm.     Heart sounds: No murmur heard. Pulmonary:     Effort: Pulmonary effort is normal. No respiratory distress.     Breath sounds: Normal breath sounds.  Lymphadenopathy:     Cervical: No cervical adenopathy.  Skin:    General: Skin is warm.     Findings: Rash present. No erythema.          Comments:    Neurological:     General: No focal deficit present.     Mental Status: He is oriented to person, place, and time.  Psychiatric:        Mood and Affect: Mood and affect normal.   ASSESSMENT AND PLAN: [name] son was seen today for tick bite and sebaceous cyst on his back.  Tick bite of left front wall of thorax, initial encounter With mild local reaction. Explained that there probability of this causing lab disease  is low. He agrees with prophylactic treatment with doxycycline 200 mg x 1. Monitor for new symptoms. He will let me know if you start with unusual headache, fatigue, arthralgias, son, in which case we can bring him back to have lyme test done.  -     Doxycycline Hyclate; Take 2 tablets (200 mg total) by mouth once for 1 dose.  Dispense: 2 tablet; Refill: 0  Sebaceous cyst We discussed Dx and prognosis. Slow growth and currently asymptomatic. He would like lesions removed, so appt with surgeon will be arranged.  -     Ambulatory referral to General Surgery  Type 2  diabetes mellitus with peripheral neuropathy [facility]) Next appointment with his endocrinologist, Dr. Elvera Lennox, on 08/27/2022.  Return if symptoms worsen or fail to improve, for keep next appointment.  Rosbel Buckner G. [place], MD  [facility] [place] office.

## 2022-08-05 NOTE — Patient Instructions (Signed)
A few things to remember from today's visit:  Sebaceous cyst - Plan: Ambulatory referral to General Surgery  Tick bite of left front wall of thorax, initial encounter - Plan: doxycycline (VIBRA-TABS) 100 MG tablet  If you need refills for medications you take chronically, please call your pharmacy. Do not use My Chart to request refills or for acute issues that need immediate attention. If you send a my chart message, it may take a few days to be addressed, specially if I am not in the office.  Please be sure medication list is accurate. If a new problem present, please set up appointment sooner than planned today.

## 2022-08-18 ENCOUNTER — Other Ambulatory Visit: Payer: Self-pay | Admitting: Family Medicine

## 2022-08-27 ENCOUNTER — Ambulatory Visit: Payer: BC Managed Care – PPO | Admitting: Internal Medicine

## 2022-09-22 NOTE — Progress Notes (Signed)
ACUTE VISIT Chief Complaint  Patient presents with   spot on forehead    Noticed x 2-3 weeks ago, seems to be healing now; had a rough texture to it   HPI: [name] is a 52 y.o. male with PMHx significant for DM II, PVC's,gout,HLD,and HTN here today concerned about skin lesion as described above. He noted lesion on his forehead that he noticed approximately three weeks ago. It is located about two inches above the right side of his eyebrow.  He reports that the skin lesion is healing slowly and has improved by about 95%. He initially thought it might be a small wart or something similar. He denies any pruritus, pain, or easy bleeding from lesion. Negative for outdoor's exposure to plants or insect bites. No hx of trauma.  No personal history of skin cancer but report that his father may have had melanomas recently.  He has been placing a waterproof band-aid over the spot when showering due to concerns about it being contagious.    He provided pictures of the forehead lesion taken on July 1st and July 10th, showing a raised,not pigmented lesion with rough texture and punctuate excoriations x 2. Lesion measuring approximately 1.5-2 mm in size. He admits to possibly scratching the spot initially but has not done so since.  He has been wearing a hat for sun protection.  Review of Systems  Constitutional:  Negative for activity change, appetite change, chills and fever.  HENT:  Negative for mouth sores and sore throat.   Eyes:  Negative for discharge and redness.  Respiratory:  Negative for cough, shortness of breath and wheezing.   Skin:  Negative for wound.  Neurological:  Negative for weakness, numbness and headaches.  See other pertinent positives and negatives in HPI.  Current Outpatient Medications on File Prior to Visit  Medication Sig Dispense Refill   allopurinol (ZYLOPRIM) 100 MG tablet TAKE 1 TABLET BY MOUTH EVERY DAY 90 tablet 2   Dulaglutide (TRULICITY) 0.75  MG/0.5ML SOPN Inject once a week. 12 mL 0   glucose blood (ONETOUCH VERIO) test strip Use once daily for glucose control, dx 250.00 100 each 3   lisinopril (ZESTRIL) 5 MG tablet TAKE 1 TABLET DAILY (ENOUGH MEDICATION UNTIL APPOINTMENT) 90 tablet 3   MAGNESIUM PO Take 1 capsule by mouth 2 (two) times daily.     metFORMIN (GLUCOPHAGE-XR) 500 MG 24 hr tablet TAKE 4 TABLETS WITH DINNER 360 tablet 3   Multiple Vitamins-Minerals (MULTIVITAMIN ADULTS) TABS Take 1 tablet by mouth at bedtime.     OneTouch Delica Lancets 33G MISC Check 1x a day as advised 100 each 3   simvastatin (ZOCOR) 20 MG tablet Take 1 tablet (20 mg total) by mouth at bedtime. 90 tablet 3   No current facility-administered medications on file prior to visit.   Past Medical History:  Diagnosis Date   Diabetes mellitus    Essential hypertension 07/03/2017   Allergies  Allergen Reactions   Iodine     Other reaction(s): Eye Redness   Penicillins Rash    Social History   Socioeconomic History   Marital status: Married    Spouse name: Not on file   Number of children: Not on file   Years of education: Not on file   Highest education level: Doctorate  Occupational History   Not on file  Tobacco Use   Smoking status: Former    Current packs/day: 0.00    Average packs/day: 1 pack/day for 18.0 years (  18.0 ttl pk-yrs)    Types: Cigarettes    Start date: 11/07/1989    Quit date: 11/08/2007    Years since quitting: 14.8   Smokeless tobacco: Never  Substance and Sexual Activity   Alcohol use: No   Drug use: No   Sexual activity: Not on file  Other Topics Concern   Not on file  Social History Narrative   Not on file   Social Determinants of Health   Financial Resource Strain: Patient Declined (08/05/2022)   Overall Financial Resource Strain (CARDIA)    Difficulty of Paying Living Expenses: Patient declined  Food Insecurity: Patient Declined (08/05/2022)   Hunger Vital Sign    Worried About Running Out of Food in the  Last Year: Patient declined    Ran Out of Food in the Last Year: Patient declined  Transportation Needs: Patient Declined (08/05/2022)   PRAPARE - Administrator, Civil Service (Medical): Patient declined    Lack of Transportation (Non-Medical): Patient declined  Physical Activity: Unknown (08/05/2022)   Exercise Vital Sign    Days of Exercise per Week: Patient declined    Minutes of Exercise per Session: Not on file  Stress: Patient Declined (08/05/2022)   [employer] of Occupational Health - Occupational Stress Questionnaire    Feeling of Stress : Patient declined  Social Connections: Unknown (08/05/2022)   Social Connection and Isolation Panel [NHANES]    Frequency of Communication with Friends and Family: Patient declined    Frequency of Social Gatherings with Friends and Family: Patient declined    Attends Religious Services: Patient declined    Database administrator or Organizations: Patient declined    Attends Banker Meetings: Not on file    Marital Status: Married    Vitals:   09/23/22 1517  BP: 128/80  Pulse: 100  Resp: 12  Temp: 98.5 F (36.9 C)  SpO2: 95%   Body mass index is 36.12 kg/m.  Physical Exam Vitals and nursing note reviewed.  Constitutional:      General: He is not in acute distress.    Appearance: Normal appearance. He is well-developed.  HENT:     Head: Normocephalic and atraumatic.      Mouth/Throat:     Mouth: Mucous membranes are moist.     Pharynx: Oropharynx is clear.  Eyes:     Conjunctiva/sclera: Conjunctivae normal.  Neck:     Trachea: No tracheal deviation.  Pulmonary:     Effort: Pulmonary effort is normal. No respiratory distress.  Lymphadenopathy:     Cervical: No cervical adenopathy.  Skin:    General: Skin is warm.     Findings: Lesion present. No erythema or rash.     Comments: A couple of SK on abdomen.  Neurological:     General: No focal deficit present.     Mental Status: He is alert and  oriented to person, place, and time.  Psychiatric:        Mood and Affect: Affect normal. Mood is anxious.   ASSESSMENT AND PLAN:  AK (actinic keratosis)  We discussed possible etiologies. He is very concerned about malignancy reassured. Explained that lesions characteristics do not suggest a serious process. AK vs SK. After a long discussion about options, he agrees with having lesion treated with liquid nitrogen. Discussed risk and benefits, verbal consent given. Treated as AK with liquid nitrogen x 3. He tolerated procedure well. Post procedure instructions given. Monitor for changes. If recurrent, we could consider dermatology  referral.  Return if symptoms worsen or fail to improve, for keep next appointment.  Eleonora Peeler G. [place], MD  [facility]. [place] office.

## 2022-09-23 ENCOUNTER — Ambulatory Visit (INDEPENDENT_AMBULATORY_CARE_PROVIDER_SITE_OTHER): Payer: BC Managed Care – PPO | Admitting: Family Medicine

## 2022-09-23 ENCOUNTER — Encounter: Payer: Self-pay | Admitting: Family Medicine

## 2022-09-23 VITALS — BP 128/80 | HR 100 | Temp 98.5°F | Resp 12 | Ht 71.0 in | Wt 259.0 lb

## 2022-09-23 DIAGNOSIS — L570 Actinic keratosis: Secondary | ICD-10-CM

## 2022-09-23 NOTE — Patient Instructions (Addendum)
A few things to remember from today's visit:  AK (actinic keratosis) Lesion does not seem suspicious. Do not scratch. Monitor for changes. Seborrheic keratosis vs actinic keratosis.  If you need refills for medications you take chronically, please call your pharmacy. Do not use My Chart to request refills or for acute issues that need immediate attention. If you send a my chart message, it may take a few days to be addressed, specially if I am not in the office.  Please be sure medication list is accurate. If a new problem present, please set up appointment sooner than planned today.

## 2022-11-02 ENCOUNTER — Other Ambulatory Visit: Payer: Self-pay | Admitting: Internal Medicine

## 2022-11-02 DIAGNOSIS — E1142 Type 2 diabetes mellitus with diabetic polyneuropathy: Secondary | ICD-10-CM

## 2022-11-13 ENCOUNTER — Ambulatory Visit: Payer: BC Managed Care – PPO | Admitting: Surgery

## 2022-11-16 ENCOUNTER — Ambulatory Visit: Payer: Self-pay | Admitting: Surgery

## 2022-12-16 ENCOUNTER — Other Ambulatory Visit: Payer: Self-pay | Admitting: Internal Medicine

## 2022-12-16 DIAGNOSIS — E1142 Type 2 diabetes mellitus with diabetic polyneuropathy: Secondary | ICD-10-CM

## 2022-12-18 ENCOUNTER — Ambulatory Visit: Payer: BC Managed Care – PPO | Admitting: Surgery

## 2022-12-18 ENCOUNTER — Encounter: Payer: Self-pay | Admitting: Surgery

## 2022-12-18 VITALS — BP 110/75 | HR 86 | Ht 71.0 in | Wt 251.2 lb

## 2022-12-18 DIAGNOSIS — L723 Sebaceous cyst: Secondary | ICD-10-CM

## 2022-12-18 NOTE — Progress Notes (Signed)
12/18/2022  Reason for Visit:  Sebaceous cysts of left upper back  Requesting Provider:  Betty [place], MD  History of Present Illness: Nicholas [name] is a 52 y.o. male presenting for evaluation of 2 sebaceous cysts in the left upper back.  The patient reports that the larger cyst has been present for a few years but has noticed that it slowly growing in size.  Denies any worsening pain but reports sometimes discomfort when lying down.  His main concern is the growth of the cyst.  Denies any current tenderness, erythema, or drainage.  In the time span of this cyst, and new cyst has grown close to the larger cyst which has a blackhead on it.  Denies any fevers, chills, chest pain, shortness of breath.  Denies any other areas of cysts.  Past Medical History: Past Medical History:  Diagnosis Date   Diabetes mellitus    Essential hypertension 07/03/2017     Past Surgical History: Past Surgical History:  Procedure Laterality Date   ANTERIOR CERVICAL DECOMP/DISCECTOMY FUSION N/A 07/03/2017   Procedure: ANTERIOR CERVICAL DECOMPRESSION/DISCECTOMY FUSION PLATING 2 C5-6 AND C67;  Surgeon: Tressie Stalker, MD;  Location: [facility] OR;  Service: Neurosurgery;  Laterality: N/A;   APPENDECTOMY      Home Medications: Prior to Admission medications   Medication Sig Start Date End Date Taking? Authorizing Provider  allopurinol (ZYLOPRIM) 100 MG tablet TAKE 1 TABLET BY MOUTH EVERY DAY 08/18/22  Yes [place], Betty G, MD  Dulaglutide (TRULICITY) 0.75 MG/0.5ML SOPN INJECT 0.75 MG UNDER THE SKIN WEEKLY 11/02/22  Yes Carlus Pavlov, MD  glucose blood (ONETOUCH VERIO) test strip Use once daily for glucose control, dx 250.00 02/19/22  Yes Carlus Pavlov, MD  lisinopril (ZESTRIL) 5 MG tablet TAKE 1 TABLET DAILY (ENOUGH MEDICATION UNTIL APPOINTMENT) 03/17/22  Yes Carlus Pavlov, MD  MAGNESIUM PO Take 1 capsule by mouth 2 (two) times daily.   Yes [provider]  metFORMIN (GLUCOPHAGE-XR) 500 MG 24 hr  tablet TAKE 4 TABLETS WITH DINNER 12/16/22  Yes Carlus Pavlov, MD  Multiple Vitamins-Minerals (MULTIVITAMIN ADULTS) TABS Take 1 tablet by mouth at bedtime.   Yes [provider]  OneTouch Delica Lancets 33G MISC Check 1x a day as advised 12/26/20  Yes Carlus Pavlov, MD  simvastatin (ZOCOR) 20 MG tablet Take 1 tablet (20 mg total) by mouth at bedtime. 03/10/22  Yes [place], Betty G, MD    Allergies: Allergies  Allergen Reactions   Iodine     Other reaction(s): Eye Redness   Penicillins Rash    Social History:  reports that he quit smoking about 15 years ago. His smoking use included cigarettes. He started smoking about 33 years ago. He has a 18 pack-year smoking history. He has never used smokeless tobacco. He reports that he does not drink alcohol and does not use drugs.   Family History: History reviewed. No pertinent family history.  Review of Systems: Review of Systems  Constitutional:  Negative for chills and fever.  Respiratory:  Negative for shortness of breath.   Cardiovascular:  Negative for chest pain.  Gastrointestinal:  Negative for nausea and vomiting.  Skin:        2 sebaceous cysts of the upper left back    Physical Exam BP 110/75 (BP Location: Left Arm, Patient Position: Sitting, Cuff Size: Large)   Pulse 86   Ht 5\' 11"  (1.803 m)   Wt 251 lb 3.2 oz (113.9 kg)   SpO2 96%   BMI 35.04 kg/m  CONSTITUTIONAL: No acute distress HEENT:  Normocephalic, atraumatic, extraocular motion intact.  RESPIRATORY:  Normal respiratory effort without pathologic use of accessory muscles. CARDIOVASCULAR: Regular rhythm and rate. MUSCULOSKELETAL: Normal gait, no peripheral edema SKIN: The patient has a 4.5 cm mass in the left upper back with a central skin pore consistent with a sebaceous cyst.  Currently there is no erythema, tenderness, or induration or drainage.  More medial to this, the patient has a 1.5 cm sebaceous cyst also with a blackhead at the center.  Also  there is no erythema, tenderness or induration or drainage. NEUROLOGIC:  Motor and sensation is grossly normal.  Cranial nerves are grossly intact. PSYCH:  Alert and oriented to person, place and time. Affect is normal.  Laboratory Analysis: No results found for this or any previous visit (from the past 24 hour(s)).  Imaging: No results found.  Assessment and Plan: This is a 52 y.o. male with 2 sebaceous cyst in the left upper back.  - Discussed with the patient the findings on exam today showing a 4.5 and a 1.5 cm sebaceous cyst in the left upper back.  Neither 1 appears to be infected at the time.  The patient denies any episodes of infection with this but his main concern is that the larger cyst has grown over the last year.  There is some discomfort particular when lying down but denies any severe pain.  Discussed with patient that we can certainly offer an office excision of these 2 cysts.  Reviewed with the patient the procedure at length including the planned incisions, risks of bleeding, infection, injury to surrounding structures, that this would be an office procedure under local anesthetic, postprocedural activity restrictions, pain control, and he is willing to proceed. - The patient is wondering about the potential cost of these procedures.  I have given him the CPT codes for each procedure so that he can contact his insurance company and obtain more information about potential co-pays and deductibles.  Tentatively, I will schedule him for procedure in the office on 01/04/2023.  If the costs are currently more prohibitive, the patient would rather schedule for next year but he can cancel our appointment if that is the case. - Patient understands this plan all of his questions have been answered.  I spent 30 minutes dedicated to the care of this patient on the date of this encounter to include pre-visit review of records, face-to-face time with the patient discussing diagnosis and  management, and any post-visit coordination of care.   Nicholas Ill, MD [facility]

## 2022-12-18 NOTE — Patient Instructions (Signed)
The surgery codes (CPT codes) that would be used for your cyst excisions are: --Larger cyst (4.5 cm):  11406, 12032 --Smaller cyst (1.5 cm):  11402, 16109

## 2022-12-31 ENCOUNTER — Encounter: Payer: Self-pay | Admitting: Internal Medicine

## 2022-12-31 ENCOUNTER — Ambulatory Visit (INDEPENDENT_AMBULATORY_CARE_PROVIDER_SITE_OTHER): Payer: BC Managed Care – PPO | Admitting: Internal Medicine

## 2022-12-31 VITALS — BP 132/80 | HR 84 | Ht 71.0 in | Wt 254.8 lb

## 2022-12-31 DIAGNOSIS — E1142 Type 2 diabetes mellitus with diabetic polyneuropathy: Secondary | ICD-10-CM | POA: Diagnosis not present

## 2022-12-31 DIAGNOSIS — E66811 Obesity, class 1: Secondary | ICD-10-CM | POA: Diagnosis not present

## 2022-12-31 DIAGNOSIS — Z7985 Long-term (current) use of injectable non-insulin antidiabetic drugs: Secondary | ICD-10-CM

## 2022-12-31 DIAGNOSIS — E785 Hyperlipidemia, unspecified: Secondary | ICD-10-CM

## 2022-12-31 DIAGNOSIS — Z7984 Long term (current) use of oral hypoglycemic drugs: Secondary | ICD-10-CM

## 2022-12-31 LAB — LIPID PANEL
Cholesterol: 141 mg/dL (ref 0–200)
HDL: 38.1 mg/dL — ABNORMAL LOW (ref >39.00)
LDL Cholesterol: 67 mg/dL (ref 0–99)
NonHDL: 103.38
Total CHOL/HDL Ratio: 4
Triglycerides: 180 mg/dL — ABNORMAL HIGH (ref 0.0–149.0)
VLDL: 36 mg/dL (ref 0.0–40.0)

## 2022-12-31 LAB — COMPREHENSIVE METABOLIC PANEL
ALT: 66 U/L — ABNORMAL HIGH (ref 0–53)
AST: 43 U/L — ABNORMAL HIGH (ref 0–37)
Albumin: 4.5 g/dL (ref 3.5–5.2)
Alkaline Phosphatase: 61 U/L (ref 39–117)
BUN: 14 mg/dL (ref 6–23)
CO2: 27 meq/L (ref 19–32)
Calcium: 9.7 mg/dL (ref 8.4–10.5)
Chloride: 102 meq/L (ref 96–112)
Creatinine, Ser: 0.94 mg/dL (ref 0.40–1.50)
GFR: 93.19 mL/min (ref >60.00)
Glucose, Bld: 169 mg/dL — ABNORMAL HIGH (ref 70–99)
Potassium: 4.4 meq/L (ref 3.5–5.1)
Sodium: 136 meq/L (ref 135–145)
Total Bilirubin: 1.1 mg/dL (ref 0.2–1.2)
Total Protein: 7.4 g/dL (ref 6.0–8.3)

## 2022-12-31 LAB — POCT GLYCOSYLATED HEMOGLOBIN (HGB A1C): Hemoglobin A1C: 7.3 % — AB (ref 4.0–5.6)

## 2022-12-31 LAB — MICROALBUMIN / CREATININE URINE RATIO
Creatinine,U: 175.6 mg/dL
Microalb Creat Ratio: 1.3 mg/g (ref 0.0–30.0)
Microalb, Ur: 2.3 mg/dL — ABNORMAL HIGH (ref 0.0–1.9)

## 2022-12-31 MED ORDER — TRULICITY 1.5 MG/0.5ML ~~LOC~~ SOAJ: 1.5000 mg | SUBCUTANEOUS | 3 refills | Status: DC

## 2022-12-31 MED ORDER — METFORMIN HCL ER 500 MG PO TB24
ORAL_TABLET | ORAL | 3 refills | Status: DC
Start: 2022-12-31 — End: 2023-11-18

## 2022-12-31 NOTE — Addendum Note (Signed)
Addended by: Pollie Meyer on: 12/31/2022 04:29 PM   Modules accepted: Orders

## 2022-12-31 NOTE — Patient Instructions (Addendum)
Please continue: - Metformin ER 2000 mg with dinner  Increase: - Trulicity 1.5 mg weekly  Please return in 4 months with your sugar log.

## 2022-12-31 NOTE — Progress Notes (Signed)
Patient ID: Nicholas [name], male   DOB: 1970/12/07, 52 y.o.   MRN: [id number]   HPI: Nicholas [name] is a 52 y.o.-year-old male, presenting for follow-up for DM2, dx in ~2012, non-insulin-dependent, uncontrolled, with complications (PN).  Last visit 10 months ago.  Interim history: No increased urination, blurry vision, nausea, chest pain.  Reviewed HbA1c levels: Lab Results  Component Value Date   HGBA1C 6.3 (A) 02/19/2022   HGBA1C 6.6 (A) 06/26/2021   HGBA1C 6.2 (A) 12/26/2020  He was on a Prednisone taper for 7 days for neck pain >> 01/2015.  Pt is on a regimen of: - Metformin ER 2000 mg with dinner - >> Trulicity 0.75 mg weekly - >> stopped...  Had diarrhea, some nausea - with regular Metformin, but not with the ER formulation He was on Glipizide before >> had to stop b/c hypoglycemia.  Pt checks his sugars once a day: - am: 101-149, ave 120 >> 103-141 (ave 126) >> 149-230 - 2h after b'fast: n/c >> 110-140 (ave 123) >> n/c >> 216 - before lunch: 98-128 (ave 116) >> 130-160, 190 (no b'fast) - 2h after lunch: n/c >> 121 >> n/c >> 98-128 (ave 176) >> n/c - before dinner: 110-130 >> 108-213 (ave 129) >> 97, 109-140 - 2h after dinner: 1180 >> 113-269 (ave 158) >> 136-179, 297 - bedtime: n/c - nighttime: n/c Lowest sugar was 87 >> 101 >> 98 >> 97; he has hypoglycemia awareness in the 70s. Highest sugar was 213 >> 180 >> 269 >> 297.  Glucometer: Verio IQ  Pt's meals are: - Breakfast: skips usually - Lunch: sandwich/salad - Dinner: varies - Snacks: rarely, 1-2  -No CKD, last BUN/creatinine:  Lab Results  Component Value Date   BUN 13 02/20/2022   BUN 11 12/26/2020   CREATININE 0.84 02/20/2022   CREATININE 0.82 12/26/2020   Lab Results  Component Value Date   MICRALBCREAT 1.9 02/20/2022   MICRALBCREAT 2.1 12/26/2020   MICRALBCREAT 2.5 01/05/2020   MICRALBCREAT 2.4 07/08/2015   MICRALBCREAT 1.2 11/06/2013   MICRALBCREAT 1.1 10/18/2012   MICRALBCREAT 1.8  12/22/2011   MICRALBCREAT 2.1 09/15/2010  On lisinopril.  -+ HL; last set of lipids: Lab Results  Component Value Date   CHOL 164 02/20/2022   HDL 46.90 02/20/2022   LDLCALC 67 12/26/2020   LDLDIRECT 92.0 02/20/2022   TRIG 204.0 (H) 02/20/2022   CHOLHDL 3 02/20/2022  On Zocor 10 mg daily.  - last eye exam was in 08/2021: No DR reportedly. Dr. Randon Goldsmith. He has some floaters.  - he has numbness in his feet.  Last foot exam 06/26/2021.  On ASA 81.  He has a history of gout.  ROS: + See HPI  I reviewed pt's medications, allergies, PMH, social hx, family hx, and changes were documented in the history of present illness. Otherwise, unchanged from my initial visit note.  Patient Active Problem List   Diagnosis Date Noted   Type 2 diabetes mellitus with peripheral neuropathy (HCC) 06/05/2010    Priority: High   Joint pain, foot 02/20/2022   Sebaceous cyst 02/20/2022   Cervical vertebral fracture (HCC) 07/03/2017   Essential hypertension 07/03/2017   Leukocytosis 07/03/2017   C7 cervical fracture (HCC) 07/03/2017   Left shoulder pain 12/19/2014   Hyperlipidemia 03/06/2008   PREMATURE VENTRICULAR CONTRACTIONS 11/07/2007   Past surgical history: - Appendectomy in 1982   Social History   Social History   Marital status: Married    Spouse name: N/A   Number  of children: 1- [place]    Occupational History   Scientist    Social History Main Topics   Smoking status: Former Smoker    Quit date: 03/10/2007   Smokeless tobacco: Never Used   Alcohol use No, 1 or 2 drinks per year    Drug use: No   Current Outpatient Medications on File Prior to Visit  Medication Sig Dispense Refill   allopurinol (ZYLOPRIM) 100 MG tablet TAKE 1 TABLET BY MOUTH EVERY DAY 90 tablet 2   Dulaglutide (TRULICITY) 0.75 MG/0.5ML SOPN INJECT 0.75 MG UNDER THE SKIN WEEKLY 6 mL 0   glucose blood (ONETOUCH VERIO) test strip Use once daily for glucose control, dx 250.00 100 each 3   lisinopril (ZESTRIL) 5  MG tablet TAKE 1 TABLET DAILY (ENOUGH MEDICATION UNTIL APPOINTMENT) 90 tablet 3   MAGNESIUM PO Take 1 capsule by mouth 2 (two) times daily.     metFORMIN (GLUCOPHAGE-XR) 500 MG 24 hr tablet TAKE 4 TABLETS WITH DINNER 360 tablet 0   Multiple Vitamins-Minerals (MULTIVITAMIN ADULTS) TABS Take 1 tablet by mouth at bedtime.     OneTouch Delica Lancets 33G MISC Check 1x a day as advised 100 each 3   simvastatin (ZOCOR) 20 MG tablet Take 1 tablet (20 mg total) by mouth at bedtime. 90 tablet 3   No current facility-administered medications on file prior to visit.   Allergies  Allergen Reactions   Iodine     Other reaction(s): Eye Redness   Penicillins Rash   Family history: - See history of present illness, also: - HTN and thyroid disease in grandfather  PE: BP 132/80   Pulse 84   Ht 5\' 11"  (1.803 m)   Wt 254 lb 12.8 oz (115.6 kg)   SpO2 97%   BMI 35.54 kg/m  Wt Readings from Last 10 Encounters:  12/31/22 254 lb 12.8 oz (115.6 kg)  12/18/22 251 lb 3.2 oz (113.9 kg)  09/23/22 259 lb (117.5 kg)  08/05/22 253 lb 2 oz (114.8 kg)  02/20/22 252 lb (114.3 kg)  02/19/22 252 lb 12.8 oz (114.7 kg)  06/26/21 253 lb 3.2 oz (114.9 kg)  12/26/20 249 lb (112.9 kg)  01/05/20 245 lb 3.2 oz (111.2 kg)  08/19/17 248 lb 3.2 oz (112.6 kg)   Constitutional: overweight, in NAD Eyes:  EOMI, no exophthalmos ENT: no neck masses, no cervical lymphadenopathy Cardiovascular: RRR, No MRG Respiratory: CTA B Musculoskeletal: no deformities Skin:no rashes Neurological: no tremor with outstretched hands  ASSESSMENT: 1. DM2, non-insulin-dependent, uncontrolled, with complications - PN  2. HL  3.  Obesity class I  PLAN:  1. Patient with longstanding, previously uncontrolled type 2 diabetes, with improved control lately on metformin extended release and weekly low-dose GLP-1 receptor agonist.  He returns after 10 months absence.  At last visit, HbA1c was lower, at 6.3%, decreased from 6.6%.  Sugars  remained mostly at goal with only some hyperglycemic spikes after dietary indiscretions and steroids.  We discussed about possible reasons for higher blood sugars in the morning (up to 140s).  I encouraged him to start some form of consistent exercise to reduce his insulin resistance and hepatic gluconeogenesis overnight.  He was exercising some, but not consistently.  I recommended a mix of cardio and strength exercises. - at today's visit, he mentions his sugars are higher and he is trying very hard to keep them at or slightly above target.  He did have some higher blood sugars since last visit, up to the 290s.  He continues to adjust diet, but I also suggest to increase the Trulicity dose for now.  This is expensive for him and we discussed that if Ozempic would be cheaper, we can also send out.  For now, we will try the higher dose of Trulicity.  Will continue metformin for now.  I refilled this for him. - I suggested to:  Patient Instructions  Please continue: - Metformin ER 2000 mg with dinner  Increase: - Trulicity 1.5 mg weekly  Please return in 4 months with your sugar log.   - we checked his HbA1c: 7.3% (higher) - advised to check sugars at different times of the day - 1x a day, rotating check times - advised for yearly eye exams >> he is not UTD but exam is coming up - will check annual labs today - return to clinic in 6 months  2. HL -Latest lipid panel was reviewed: LDL at goal, triglycerides high: Lab Results  Component Value Date   CHOL 164 02/20/2022   HDL 46.90 02/20/2022   LDLCALC 67 12/26/2020   LDLDIRECT 92.0 02/20/2022   TRIG 204.0 (H) 02/20/2022   CHOLHDL 3 02/20/2022  -He continues simvastatin 20 mg daily without side effects -Will check his lipids today  3. Obesity class I -Will continue Trulicity which should also help with weight loss -She lost 1 pound before last visit and gained 2 lbs since then  Component     Latest Ref Rng 12/31/2022  Sodium     135  - 145 mEq/L 136   Potassium     3.5 - 5.1 mEq/L 4.4   Chloride     96 - 112 mEq/L 102   CO2     19 - 32 mEq/L 27   Glucose     70 - 99 mg/dL 086 (H)   BUN     6 - 23 mg/dL 14   Creatinine     5.78 - 1.50 mg/dL 4.69   Total Bilirubin     0.2 - 1.2 mg/dL 1.1   Alkaline Phosphatase     39 - 117 U/L 61   AST     0 - 37 U/L 43 (H)   ALT     0 - 53 U/L 66 (H)   Total Protein     6.0 - 8.3 g/dL 7.4   Albumin     3.5 - 5.2 g/dL 4.5   GFR     >62.95 mL/min 93.19   Calcium     8.4 - 10.5 mg/dL 9.7   Cholesterol     0 - 200 mg/dL 284   Triglycerides     0.0 - 149.0 mg/dL 132.4 (H)   HDL Cholesterol     >39.00 mg/dL 40.10 (L)   VLDL     0.0 - 40.0 mg/dL 27.2   Total CHOL/HDL Ratio 4   NonHDL 103.38   Microalb, Ur     0.0 - 1.9 mg/dL 2.3 (H)   Creatinine,U     mg/dL 536.6   MICROALB/CREAT RATIO     0.0 - 30.0 mg/g 1.3   LDL (calc)     0 - 99 mg/dL 67   ACR not elevated LDL at goal, triglycerides slightly high, but this was not a fasting sample.  HDL slightly low.  Will advise him to continue simvastatin 20 mg daily. LFTs are elevated.  They have been previously normal.  I will direct the patient to PCP for further investigation.  Of note, hep  C antibody test was nonreactive at the end of last year.  If this is related to fatty liver, increasing the Trulicity dose will help.  Nicholas Pavlov, MD PhD [facility] Endocrinology

## 2023-01-04 ENCOUNTER — Ambulatory Visit: Payer: BC Managed Care – PPO | Admitting: Surgery

## 2023-02-15 ENCOUNTER — Other Ambulatory Visit: Payer: Self-pay | Admitting: Family Medicine

## 2023-02-23 ENCOUNTER — Encounter: Payer: BC Managed Care – PPO | Admitting: Family Medicine

## 2023-03-12 ENCOUNTER — Telehealth: Payer: Self-pay

## 2023-03-12 ENCOUNTER — Other Ambulatory Visit: Payer: Self-pay | Admitting: Internal Medicine

## 2023-03-12 DIAGNOSIS — E1142 Type 2 diabetes mellitus with diabetic polyneuropathy: Secondary | ICD-10-CM

## 2023-03-12 NOTE — Telephone Encounter (Signed)
 Message left for the patient seeing if he is ready to reschedule his in office procedure with Dr Aleen Campi. He may call to schedule this. He will need 60 minutes for this procedure.

## 2023-03-12 NOTE — Telephone Encounter (Signed)
 Lisinopril  refill request complete

## 2023-04-30 ENCOUNTER — Encounter: Payer: BC Managed Care – PPO | Admitting: Family Medicine

## 2023-05-06 ENCOUNTER — Ambulatory Visit (INDEPENDENT_AMBULATORY_CARE_PROVIDER_SITE_OTHER): Payer: BC Managed Care – PPO | Admitting: Internal Medicine

## 2023-05-06 ENCOUNTER — Encounter: Payer: Self-pay | Admitting: Internal Medicine

## 2023-05-06 VITALS — BP 122/80 | HR 96 | Ht 71.0 in | Wt 252.0 lb

## 2023-05-06 DIAGNOSIS — Z7984 Long term (current) use of oral hypoglycemic drugs: Secondary | ICD-10-CM | POA: Diagnosis not present

## 2023-05-06 DIAGNOSIS — E66811 Obesity, class 1: Secondary | ICD-10-CM | POA: Diagnosis not present

## 2023-05-06 DIAGNOSIS — Z7985 Long-term (current) use of injectable non-insulin antidiabetic drugs: Secondary | ICD-10-CM

## 2023-05-06 DIAGNOSIS — E785 Hyperlipidemia, unspecified: Secondary | ICD-10-CM | POA: Diagnosis not present

## 2023-05-06 DIAGNOSIS — E1142 Type 2 diabetes mellitus with diabetic polyneuropathy: Secondary | ICD-10-CM | POA: Diagnosis not present

## 2023-05-06 LAB — POCT GLYCOSYLATED HEMOGLOBIN (HGB A1C): Hemoglobin A1C: 6.7 % — AB (ref 4.0–5.6)

## 2023-05-06 NOTE — Progress Notes (Signed)
 Patient ID: Nicholas [name], male   DOB: 1970/08/28, 53 y.o.   MRN: [id number]   HPI: Nicholas [name] is a 53 y.o.-year-old male, presenting for follow-up for DM2, dx in ~2012, non-insulin-dependent, uncontrolled, with complications (PN).  Last visit 4 months ago.  Interim history: No increased urination, blurry vision, nausea, chest pain.  Reviewed HbA1c levels: Lab Results  Component Value Date   HGBA1C 7.3 (A) 12/31/2022   HGBA1C 6.3 (A) 02/19/2022   HGBA1C 6.6 (A) 06/26/2021  He was on a Prednisone taper for 7 days for neck pain >> 01/2015.  Pt is on a regimen of: - Metformin ER 2000 mg with dinner - Trulicity 0.75 >> 1.5 mg weekly Had diarrhea, some nausea - with regular Metformin, but not with the ER formulation He was on Glipizide before >> had to stop b/c hypoglycemia. He was previously on Farxiga 10 mg daily but came off... He was previously on Januvia before switching to Trulicity.  Pt checks his sugars once a day: - am: 101-149, ave 120 >> 103-141 (ave 126) >> 149-230 >> 114-157 (ave 138) - 2h after b'fast: n/c >> 110-140 (ave 123) >> n/c >> 216 >> n/c - before lunch: 98-128 (ave 116) >> 130-160, 190 (no b'fast) - 2h after lunch: n/c >> 121 >> n/c >> 98-128 (ave 176) >> n/c - before dinner: 110-130 >> 108-213 (ave 129) >> 97, 109-140 >> 81-113 - 2h after dinner: 1180 >> 113-269 (ave 158) >> 136-179, 297>> 104-240 (ave 141) - bedtime: n/c >> 114-138 (122) - nighttime: n/c Lowest sugar was 87 >> 101 >> 98 >> 97 >> 81; he has hypoglycemia awareness in the 70s. Highest sugar was 213 >> 180 >> 269 >> 297 >> 240.  Glucometer: Verio IQ  Pt's meals are: - Breakfast: skips usually - Lunch: sandwich/salad - Dinner: varies - Snacks: rarely, 1-2  -No CKD, last BUN/creatinine:  Lab Results  Component Value Date   BUN 14 12/31/2022   BUN 13 02/20/2022   CREATININE 0.94 12/31/2022   CREATININE 0.84 02/20/2022   Lab Results  Component Value Date   MICRALBCREAT 1.3  12/31/2022   MICRALBCREAT 1.9 02/20/2022   MICRALBCREAT 2.1 12/26/2020   MICRALBCREAT 2.5 01/05/2020   MICRALBCREAT 2.4 07/08/2015   MICRALBCREAT 1.2 11/06/2013   MICRALBCREAT 1.1 10/18/2012   MICRALBCREAT 1.8 12/22/2011   MICRALBCREAT 2.1 09/15/2010  On lisinopril.  -+ HL; last set of lipids: Lab Results  Component Value Date   CHOL 141 12/31/2022   HDL 38.10 (L) 12/31/2022   LDLCALC 67 12/31/2022   LDLDIRECT 92.0 02/20/2022   TRIG 180.0 (H) 12/31/2022   CHOLHDL 4 12/31/2022  On Zocor 10 mg daily.  - last eye exam was in 08/2021: No DR reportedly. Dr. Randon Goldsmith. He has some floaters.  - he has numbness in his feet.  Last foot exam 12/31/2022.  On ASA 81.  He has a history of gout.  ROS: + See HPI  I reviewed pt's medications, allergies, PMH, social hx, family hx, and changes were documented in the history of present illness. Otherwise, unchanged from my initial visit note.  Patient Active Problem List   Diagnosis Date Noted   Type 2 diabetes mellitus with peripheral neuropathy (HCC) 06/05/2010    Priority: High   Joint pain, foot 02/20/2022   Sebaceous cyst 02/20/2022   Cervical vertebral fracture (HCC) 07/03/2017   Essential hypertension 07/03/2017   Leukocytosis 07/03/2017   C7 cervical fracture (HCC) 07/03/2017   Left shoulder pain 12/19/2014  Hyperlipidemia 03/06/2008   PREMATURE VENTRICULAR CONTRACTIONS 11/07/2007   Past surgical history: - Appendectomy in 1982   Social History   Social History   Marital status: Married    Spouse name: N/A   Number of children: 1- Ste[place]    Occupational History   Scientist    Social History Main Topics   Smoking status: Former Smoker    Quit date: 03/10/2007   Smokeless tobacco: Never Used   Alcohol use No, 1 or 2 drinks per year    Drug use: No   Current Outpatient Medications on File Prior to Visit  Medication Sig Dispense Refill   allopurinol (ZYLOPRIM) 100 MG tablet TAKE 1 TABLET BY MOUTH EVERY DAY 90  tablet 2   Dulaglutide (TRULICITY) 1.5 MG/0.5ML SOAJ Inject 1.5 mg into the skin once a week. 6 mL 3   glucose blood (ONETOUCH VERIO) test strip Use once daily for glucose control, dx 250.00 100 each 3   lisinopril (ZESTRIL) 5 MG tablet TAKE 1 TABLET DAILY (ENOUGH MEDICATION UNTIL APPOINTMENT) 90 tablet 3   MAGNESIUM PO Take 1 capsule by mouth 2 (two) times daily.     metFORMIN (GLUCOPHAGE-XR) 500 MG 24 hr tablet TAKE 4 TABLETS WITH DINNER 360 tablet 3   Multiple Vitamins-Minerals (MULTIVITAMIN ADULTS) TABS Take 1 tablet by mouth at bedtime.     OneTouch Delica Lancets 33G MISC Check 1x a day as advised 100 each 3   simvastatin (ZOCOR) 20 MG tablet TAKE 1 TABLET BY MOUTH EVERYDAY AT BEDTIME 90 tablet 3   No current facility-administered medications on file prior to visit.   Allergies  Allergen Reactions   Iodine     Other reaction(s): Eye Redness   Penicillins Rash   Family history: - See history of present illness, also: - HTN and thyroid disease in grandfather  PE: BP 122/80   Pulse 96   Ht 5\' 11"  (1.803 m)   Wt 252 lb (114.3 kg)   SpO2 96%   BMI 35.15 kg/m  Wt Readings from Last 10 Encounters:  05/06/23 252 lb (114.3 kg)  12/31/22 254 lb 12.8 oz (115.6 kg)  12/18/22 251 lb 3.2 oz (113.9 kg)  09/23/22 259 lb (117.5 kg)  08/05/22 253 lb 2 oz (114.8 kg)  02/20/22 252 lb (114.3 kg)  02/19/22 252 lb 12.8 oz (114.7 kg)  06/26/21 253 lb 3.2 oz (114.9 kg)  12/26/20 249 lb (112.9 kg)  01/05/20 245 lb 3.2 oz (111.2 kg)   Constitutional: overweight, in NAD Eyes:  EOMI, no exophthalmos ENT: no neck masses, no cervical lymphadenopathy Cardiovascular: RRR (no tachycardia at the time of the physical exam), No MRG Respiratory: CTA B Musculoskeletal: no deformities Skin:no rashes Neurological: no tremor with outstretched hands  ASSESSMENT: 1. DM2, non-insulin-dependent, uncontrolled, with complications - PN  2. HL  3.  Obesity class I  PLAN:  1. Patient with  longstanding, previously uncontrolled type 2 diabetes, with improved control lately on metformin ER and weekly GLP-1 receptor agonist.  At last visit, he returned after 10 months absence.  HbA1c increased from 6.3% to 7.3%.  Sugars were higher and he was working very hard to keep them at or slightly above target.  Highest blood sugars were in the 290s.  I advised him to continue to adjust diet but I also recommended to try to increase the Trulicity dose.  This was expensive for him and we discussed that he should check with his pharmacy and see if Ozempic would be cheaper.  We continued metformin.  I refilled this for him.  I also recommended exercise to improve his insulin resistance and decreased nocturnal hepatic gluconeogenesis.  We discussed that this will allow him to wake up with better blood sugars in the morning. -At today's visit sugars appear to have improved.  They are definitely lower in the morning, with still occasional hyperglycemic spikes.  We discussed all possible causes for these.  However, overall, sugars are mostly at goal.  I did not suggest a change in regimen.  We did discuss about possibly switching to Ozempic but as of now, I do not feel strongly we need to do so.  I recommended to try to improve his diet while on Trulicity but he can increase the dose at next visit if needed.  Will continue metformin for now. - I suggested to:  Patient Instructions  Please continue: - Metformin ER 2000 mg with dinner - Trulicity 1.5 mg weekly  Please return in 4 months with your sugar log.   - we checked his HbA1c: 6.7% (decreased) - advised to check sugars at different times of the day - 1x a day, rotating check times - advised for yearly eye exams >> he is UTD - at last visit, his LFTs were elevated.  Hep C test was nonreactive.  I advised him to discuss with PCP to see if further investigation was needed. - return to clinic in 4 months  2. HL -Let lipid panel was reviewed: LDL at goal,  triglycerides slightly high (but not fasting), HDL slightly low: Lab Results  Component Value Date   CHOL 141 12/31/2022   HDL 38.10 (L) 12/31/2022   LDLCALC 67 12/31/2022   LDLDIRECT 92.0 02/20/2022   TRIG 180.0 (H) 12/31/2022   CHOLHDL 4 12/31/2022  -He continues simvastatin 20 mg daily without side effects  3. Obesity class I -Last visit I advised him to increase the Trulicity dose, which should also help with weight loss -He gained 2 pounds before last visit -he lost 3 lbs since last OV  Carlus Pavlov, MD PhD [facility] Endocrinology

## 2023-05-06 NOTE — Patient Instructions (Signed)
 Please continue: - Metformin ER 2000 mg with dinner - Trulicity 1.5 mg weekly  Please return in 4 months with your sugar log.

## 2023-05-12 ENCOUNTER — Ambulatory Visit: Payer: Self-pay | Admitting: Family Medicine

## 2023-05-12 NOTE — Telephone Encounter (Signed)
 Noted.

## 2023-05-12 NOTE — Telephone Encounter (Signed)
  Chief Complaint: lump / swelling on back. Dx cyst per patient. Red pain level 2-3 Symptoms: "cyst" on back becoming larger and more pain . Only level 2-3/10 at this time but can feel area more than normal . Lump has been present since Oct. And now getting size of quarter or half dollar size Frequency: 1 week more tender Pertinent Negatives: Patient denies fever no drainage Disposition: [] ED /[] Urgent Care (no appt availability in office) / [x] Appointment(In office/virtual)/ []  [facility]/ [] Home Care/ [] Refused Recommended Disposition /[] [facility]/ []  Follow-up with PCP Additional Notes:   Appt scheduled with PCP 05/14/23. Recommended to call back if pain worsens or fever noted. Can apply warm compress and remove after 20 minutes For comfort.   Please advise for other recommendations.     Copied from CRM [id number]. Topic: Clinical - Red Word Triage >> May 12, 2023  9:20 AM Theodis Sato wrote: Red Word that prompted transfer to Nurse Triage: Cyst on back with redness and a lot of swelling. Patient states the cyst in painful. Reason for Disposition  [1] Small swelling or lump AND [2] unexplained AND [3] present > 1 week  Answer Assessment - Initial Assessment Questions 1. APPEARANCE of SWELLING: "What does it look like?"     Red swelling  2. SIZE: "How large is the swelling?" (e.g., inches, cm; or compare to size of pinhead, tip of pen, eraser, coin, pea, grape, ping pong ball)      Quarter size to half dollar size 3. LOCATION: "Where is the swelling located?"     Back worsening x 1 week  4. ONSET: "When did the swelling start?"     1 week  5. COLOR: "What color is it?" "Is there more than one color?"     red 6. PAIN: "Is there any pain?" If Yes, ask: "How bad is the pain?" (e.g., scale 1-10; or mild, moderate, severe)     - NONE (0): no pain   - MILD (1-3): doesn't interfere with normal activities    - MODERATE (4-7): interferes with normal activities or awakens  from sleep    - SEVERE (8-10): excruciating pain, unable to do any normal activities     Sensitive to touch  7. ITCH: "Does it itch?" If Yes, ask: "How bad is the itch?"      na 8. CAUSE: "What do you think caused the swelling?" Possible cyst  9 OTHER SYMPTOMS: "Do you have any other symptoms?" (e.g., fever)     no  Protocols used: Skin Lump or Localized Swelling-A-AH

## 2023-05-12 NOTE — Progress Notes (Signed)
 ACUTE VISIT Chief Complaint  Patient presents with   lump on back     Has had for a while but is starting to get bigger and painful, red.    HPI: [name] is a 53 y.o. male with a PMHx significant for DM II, PVC's, gout, HLD, and HTN, who is here today complaining of a lump on his back as described above.  Hx of sebaceous cyst in his back..  He has already seen a Careers adviser and been told the cyst could be removed, but has not scheduled a procedure yet.   He complains of a growing "lump" on his upper back. He mentioned it in the fall,  it has turned red and become painful over the last week.  It is increasing in size. He has not noted drainage.  He mentions he felt mildly febrile and had some chills one night about a week and a half ago, but did not check his temperature.  Not taking anything for the pain.  Pertinent negatives include body aches, cough, SOB, or difficulty swallowing.   Review of Systems  Constitutional:  Negative for activity change, appetite change and fatigue.  HENT:  Negative for mouth sores and sore throat.   Cardiovascular:  Negative for chest pain.  Gastrointestinal:  Negative for abdominal pain and nausea.  Skin:  Negative for rash.  Neurological:  Negative for syncope, weakness and headaches.  See other pertinent positives and negatives in HPI.  Current Outpatient Medications on File Prior to Visit  Medication Sig Dispense Refill   allopurinol (ZYLOPRIM) 100 MG tablet TAKE 1 TABLET BY MOUTH EVERY DAY 90 tablet 2   Dulaglutide (TRULICITY) 1.5 MG/0.5ML SOAJ Inject 1.5 mg into the skin once a week. 6 mL 3   glucose blood (ONETOUCH VERIO) test strip Use once daily for glucose control, dx 250.00 100 each 3   lisinopril (ZESTRIL) 5 MG tablet TAKE 1 TABLET DAILY (ENOUGH MEDICATION UNTIL APPOINTMENT) 90 tablet 3   MAGNESIUM PO Take 1 capsule by mouth 2 (two) times daily.     metFORMIN (GLUCOPHAGE-XR) 500 MG 24 hr tablet TAKE 4 TABLETS WITH DINNER 360  tablet 3   Multiple Vitamins-Minerals (MULTIVITAMIN ADULTS) TABS Take 1 tablet by mouth at bedtime.     OneTouch Delica Lancets 33G MISC Check 1x a day as advised 100 each 3   simvastatin (ZOCOR) 20 MG tablet TAKE 1 TABLET BY MOUTH EVERYDAY AT BEDTIME 90 tablet 3   No current facility-administered medications on file prior to visit.    Past Medical History:  Diagnosis Date   Diabetes mellitus    Essential hypertension 07/03/2017   Allergies  Allergen Reactions   Iodine     Other reaction(s): Eye Redness   Penicillins Rash    Social History   Socioeconomic History   Marital status: Married    Spouse name: Not on file   Number of children: Not on file   Years of education: Not on file   Highest education level: Doctorate  Occupational History   Not on file  Tobacco Use   Smoking status: Former    Current packs/day: 0.00    Average packs/day: 1 pack/day for 18.0 years (18.0 ttl pk-yrs)    Types: Cigarettes    Start date: 11/07/1989    Quit date: 11/08/2007    Years since quitting: 15.5   Smokeless tobacco: Never  Substance and Sexual Activity   Alcohol use: No   Drug use: No   Sexual activity:  Not on file  Other Topics Concern   Not on file  Social History Narrative   Not on file   Social Drivers of Health   Financial Resource Strain: Patient Declined (05/14/2023)   Overall Financial Resource Strain (CARDIA)    Difficulty of Paying Living Expenses: Patient declined  Food Insecurity: Patient Declined (05/14/2023)   Hunger Vital Sign    Worried About Running Out of Food in the Last Year: Patient declined    Ran Out of Food in the Last Year: Patient declined  Transportation Needs: Patient Declined (05/14/2023)   PRAPARE - Administrator, Civil Service (Medical): Patient declined    Lack of Transportation (Non-Medical): Patient declined  Physical Activity: Unknown (05/14/2023)   Exercise Vital Sign    Days of Exercise per Week: Patient declined    Minutes of  Exercise per Session: Not on file  Stress: Patient Declined (05/14/2023)   [employer] of Occupational Health - Occupational Stress Questionnaire    Feeling of Stress : Patient declined  Social Connections: Unknown (05/14/2023)   Social Connection and Isolation Panel [NHANES]    Frequency of Communication with Friends and Family: Patient declined    Frequency of Social Gatherings with Friends and Family: Patient declined    Attends Religious Services: Patient declined    Database administrator or Organizations: Patient declined    Attends Banker Meetings: Not on file    Marital Status: Patient declined    Vitals:   05/14/23 0758  BP: 124/80  Pulse: 100  Resp: 12  Temp: 97.9 F (36.6 C)  SpO2: 96%   Body mass index is 35.06 kg/m.  Physical Exam Vitals and nursing note reviewed.  Constitutional:      General: He is not in acute distress.    Appearance: He is well-developed.  HENT:     Head: Normocephalic and atraumatic.  Eyes:     Conjunctiva/sclera: Conjunctivae normal.  Cardiovascular:     Rate and Rhythm: Normal rate and regular rhythm.     Heart sounds: No murmur heard. Pulmonary:     Effort: Pulmonary effort is normal. No respiratory distress.     Breath sounds: Normal breath sounds.  Lymphadenopathy:     Cervical: No cervical adenopathy.  Skin:    General: Skin is warm.     Findings: No erythema or rash.          Comments: Also about 2-2.5 cm nodular lesion with black depressed area in the center. No erythema or tenderness.  Neurological:     General: No focal deficit present.     Mental Status: He is alert and oriented to person, place, and time.     Gait: Gait normal.  Psychiatric:        Mood and Affect: Mood and affect normal.    ASSESSMENT AND PLAN:  [name] was seen today for a lump on his back.   Abscess of back Induration around erythematous area, so no I&D done today. Recommend warm compressions a few times during the  day. Discussed possible complications, instructed about warning signs. Bactrim DS bid x 7d. Surgical consultation will be arranged.  -     Sulfamethoxazole-Trimethoprim; Take 1 tablet by mouth 2 (two) times daily for 5 days.  Dispense: 10 tablet; Refill: 0  Sebaceous cyst Upper back x 2. We discussed Dx and possible complications. Surgery referral placed.  I spent a total of 30 minutes in both face to face and non face  to face activities for this visit on the date of this encounter. During this time history was obtained and documented, examination was performed, and assessment/plan discussed.  Return if symptoms worsen or fail to improve.  I, Rolla Etienne Wierda, acting as a scribe for Marli Diego [place], MD., have documented all relevant documentation on the behalf of Chaunte Hornbeck [place], MD, as directed by  Enedelia Martorelli [place], MD while in the presence of Maury Groninger [place], MD.   I, Taylen Wendland [place], MD, have reviewed all documentation for this visit. The documentation on 05/14/23 for the exam, diagnosis, procedures, and orders are all accurate and complete.  Anaih Brander G. [place], MD  [facility]. [place] office.

## 2023-05-14 ENCOUNTER — Encounter: Payer: Self-pay | Admitting: Family Medicine

## 2023-05-14 ENCOUNTER — Ambulatory Visit: Admitting: Family Medicine

## 2023-05-14 VITALS — BP 124/80 | HR 100 | Temp 97.9°F | Resp 12 | Ht 71.0 in | Wt 251.4 lb

## 2023-05-14 DIAGNOSIS — L02212 Cutaneous abscess of back [any part, except buttock]: Secondary | ICD-10-CM | POA: Diagnosis not present

## 2023-05-14 DIAGNOSIS — L723 Sebaceous cyst: Secondary | ICD-10-CM | POA: Diagnosis not present

## 2023-05-14 MED ORDER — SULFAMETHOXAZOLE-TRIMETHOPRIM 800-160 MG PO TABS: 1.0000 | ORAL_TABLET | Freq: Two times a day (BID) | ORAL | 0 refills | Status: DC

## 2023-05-14 NOTE — Patient Instructions (Addendum)
 A few things to remember from today's visit:  Abscess of back - Plan: sulfamethoxazole-trimethoprim (BACTRIM DS) 800-160 MG tablet  Sebaceous cyst Warm compresses a few times per day. Start antibiotic. Call surgeon's office to arrange removal. Monitor for fever.  If you need refills for medications you take chronically, please call your pharmacy. Do not use My Chart to request refills or for acute issues that need immediate attention. If you send a my chart message, it may take a few days to be addressed, specially if I am not in the office.  Please be sure medication list is accurate. If a new problem present, please set up appointment sooner than planned today.

## 2023-05-15 ENCOUNTER — Encounter: Payer: Self-pay | Admitting: Family Medicine

## 2023-05-15 ENCOUNTER — Telehealth: Admitting: Physician Assistant

## 2023-05-15 DIAGNOSIS — T370X5A Adverse effect of sulfonamides, initial encounter: Secondary | ICD-10-CM | POA: Diagnosis not present

## 2023-05-15 DIAGNOSIS — M2550 Pain in unspecified joint: Secondary | ICD-10-CM

## 2023-05-15 DIAGNOSIS — T50905A Adverse effect of unspecified drugs, medicaments and biological substances, initial encounter: Secondary | ICD-10-CM

## 2023-05-15 DIAGNOSIS — T39315A Adverse effect of propionic acid derivatives, initial encounter: Secondary | ICD-10-CM

## 2023-05-15 DIAGNOSIS — T391X5A Adverse effect of 4-Aminophenol derivatives, initial encounter: Secondary | ICD-10-CM | POA: Diagnosis not present

## 2023-05-15 NOTE — Progress Notes (Signed)
 Virtual Visit Consent   Nicholas [name], you are scheduled for a virtual visit with a [facility] provider today. Just as with appointments in the office, your consent must be obtained to participate. Your consent will be active for this visit and any virtual visit you may have with one of our providers in the next 365 days. If you have a MyChart account, a copy of this consent can be sent to you electronically.  As this is a virtual visit, video technology does not allow for your provider to perform a traditional examination. This may limit your provider's ability to fully assess your condition. If your provider identifies any concerns that need to be evaluated in person or the need to arrange testing (such as labs, EKG, etc.), we will make arrangements to do so. Although advances in technology are sophisticated, we cannot ensure that it will always work on either your end or our end. If the connection with a video visit is poor, the visit may have to be switched to a telephone visit. With either a video or telephone visit, we are not always able to ensure that we have a secure connection.  By engaging in this virtual visit, you consent to the provision of healthcare and authorize for your insurance to be billed (if applicable) for the services provided during this visit. Depending on your insurance coverage, you may receive a charge related to this service.  I need to obtain your verbal consent now. Are you willing to proceed with your visit today? [name] has provided verbal consent on 05/15/2023 for a virtual visit (video or telephone). Nicholas [name], [place]  Date: 05/15/2023 3:39 PM   Virtual Visit via Video Note   I, Nicholas [name], connected with  Nicholas [name]  ([id number], [birth year]) on 05/15/23 at  3:30 PM EST by a video-enabled telemedicine application and verified that I am speaking with the correct person using two identifiers.  Location: Patient: Virtual Visit Location Patient:  Home Provider: Virtual Visit Location Provider: Home Office   I discussed the limitations of evaluation and management by telemedicine and the availability of in person appointments. The patient expressed understanding and agreed to proceed.    History of Present Illness: Nicholas [name] is a 53 y.o. who identifies as a male who was assigned male at birth, and is being seen today for medication concern.  HPI: Allergic Reaction This is a new problem. The current episode started today. The problem has been resolved since onset. The patient was exposed to an antibiotic. The time of exposure was separated in time from the onset of symptoms. The exposure occurred at Home. (Joint pain) There is no swelling present. Past treatments include acetaminophen and ibuprofen. The treatment provided significant relief.    Problems:  Patient Active Problem List   Diagnosis Date Noted   Joint pain, foot 02/20/2022   Sebaceous cyst 02/20/2022   Cervical vertebral fracture (HCC) 07/03/2017   Essential hypertension 07/03/2017   Leukocytosis 07/03/2017   C7 cervical fracture (HCC) 07/03/2017   Left shoulder pain 12/19/2014   Type 2 diabetes mellitus with peripheral neuropathy (HCC) 06/05/2010   Hyperlipidemia 03/06/2008   PREMATURE VENTRICULAR CONTRACTIONS 11/07/2007    Allergies:  Allergies  Allergen Reactions   Iodine     Other reaction(s): Eye Redness   Penicillins Rash   Medications:  Current Outpatient Medications:    allopurinol (ZYLOPRIM) 100 MG tablet, TAKE 1 TABLET BY MOUTH EVERY DAY, Disp: 90 tablet, Rfl: 2  Dulaglutide (TRULICITY) 1.5 MG/0.5ML SOAJ, Inject 1.5 mg into the skin once a week., Disp: 6 mL, Rfl: 3   glucose blood (ONETOUCH VERIO) test strip, Use once daily for glucose control, dx 250.00, Disp: 100 each, Rfl: 3   lisinopril (ZESTRIL) 5 MG tablet, TAKE 1 TABLET DAILY (ENOUGH MEDICATION UNTIL APPOINTMENT), Disp: 90 tablet, Rfl: 3   MAGNESIUM PO, Take 1 capsule by mouth 2 (two)  times daily., Disp: , Rfl:    metFORMIN (GLUCOPHAGE-XR) 500 MG 24 hr tablet, TAKE 4 TABLETS WITH DINNER, Disp: 360 tablet, Rfl: 3   Multiple Vitamins-Minerals (MULTIVITAMIN ADULTS) TABS, Take 1 tablet by mouth at bedtime., Disp: , Rfl:    OneTouch Delica Lancets 33G MISC, Check 1x a day as advised, Disp: 100 each, Rfl: 3   simvastatin (ZOCOR) 20 MG tablet, TAKE 1 TABLET BY MOUTH EVERYDAY AT BEDTIME, Disp: 90 tablet, Rfl: 3   sulfamethoxazole-trimethoprim (BACTRIM DS) 800-160 MG tablet, Take 1 tablet by mouth 2 (two) times daily for 5 days., Disp: 10 tablet, Rfl: 0  Observations/Objective: Patient is well-developed, well-nourished in no acute distress.  Resting comfortably  at home.  Head is normocephalic, atraumatic.  No labored breathing.  Speech is clear and coherent with logical content.  Patient is alert and oriented at baseline.    Assessment and Plan: 1. Adverse drug interaction with prescription medication (Primary)  2. Arthralgia, unspecified joint  Patient with joint pain after initiating bactrim. Counseled to discontinue bactrim at this time. Counseled him to also follow up with primary treating provider for alternative related to suspected pathogen or treatment prior to his procedure.   Follow Up Instructions: I discussed the assessment and treatment plan with the patient. The patient was provided an opportunity to ask questions and all were answered. The patient agreed with the plan and demonstrated an understanding of the instructions.  A copy of instructions were sent to the patient via MyChart unless otherwise noted below.     The patient was advised to call back or seek an in-person evaluation if the symptoms worsen or if the condition fails to improve as anticipated.    Nicholas Kidney, PA-C

## 2023-05-17 ENCOUNTER — Other Ambulatory Visit: Payer: Self-pay | Admitting: Family Medicine

## 2023-05-17 ENCOUNTER — Telehealth: Payer: Self-pay

## 2023-05-17 DIAGNOSIS — L02212 Cutaneous abscess of back [any part, except buttock]: Secondary | ICD-10-CM

## 2023-05-17 MED ORDER — DOXYCYCLINE HYCLATE 100 MG PO TABS: 100.0000 mg | ORAL_TABLET | Freq: Two times a day (BID) | ORAL | 0 refills | Status: DC

## 2023-05-17 NOTE — Telephone Encounter (Signed)
 Copied from CRM [id number]. Topic: Clinical - Medical Advice >> May 17, 2023  1:19 PM Isabelle Course C wrote: Reason for CRM: Patient called that he met with [name] 3/08 at 3:30 pm and he recommended patient to discontinue the sulfamethoxazole-TMP and that I he contact Dr.  Maurice Small dose of the sulfamethoxazole-TMP was Saturday at 8am.  Please advise as to what to do.

## 2023-05-17 NOTE — Telephone Encounter (Signed)
 See my chart encounter.

## 2023-05-18 ENCOUNTER — Other Ambulatory Visit: Payer: Self-pay | Admitting: Family Medicine

## 2023-05-20 ENCOUNTER — Encounter: Payer: Self-pay | Admitting: Family Medicine

## 2023-05-20 ENCOUNTER — Encounter: Payer: Self-pay | Admitting: Surgery

## 2023-05-21 ENCOUNTER — Encounter: Payer: Self-pay | Admitting: Surgery

## 2023-05-21 ENCOUNTER — Ambulatory Visit (INDEPENDENT_AMBULATORY_CARE_PROVIDER_SITE_OTHER): Admitting: Surgery

## 2023-05-21 VITALS — BP 137/86 | HR 89 | Temp 97.9°F | Ht 71.0 in | Wt 246.0 lb

## 2023-05-21 DIAGNOSIS — L723 Sebaceous cyst: Secondary | ICD-10-CM

## 2023-05-21 DIAGNOSIS — L02212 Cutaneous abscess of back [any part, except buttock]: Secondary | ICD-10-CM

## 2023-05-21 MED ORDER — OXYCODONE HCL 5 MG PO TABS: 5.0000 mg | ORAL_TABLET | Freq: Four times a day (QID) | ORAL | 0 refills | Status: DC | PRN

## 2023-05-21 MED ORDER — DOXYCYCLINE HYCLATE 100 MG PO TABS
100.0000 mg | ORAL_TABLET | Freq: Two times a day (BID) | ORAL | 0 refills | Status: AC
Start: 2023-05-21 — End: 2023-05-28

## 2023-05-21 NOTE — Patient Instructions (Signed)
 Today we have drained your Abscess in the office. The numbing medication will wear off in approximately 4-8 hours. You will have some pain to the area afterwards but should not be as severe as prior to the procedure.  If you have been given antibiotics, please continue to take them after your procedure.  You may take 2 extra strength Tylenol, or 3 regular Ibuprofen tablets every 6 hours as needed for pain and discomfort.  You may shower. First remove all of the packing and wash letting the warm soapy water run over the area, rinse well, and pat dry.   Wound Packing  Wound packing usually involves placing a moistened packing material into your wound and then covering it with an outer bandage (dressing). This helps support the healing of deep tissue and tissue under the skin. It also helps prevent bleeding, infection, and further injury. Wounds are packed until deep tissues heal. The time it takes for this to happen is different for everyone. Your health care provider will show you how to pack and dress your wound. Using gloves and a clean technique is important to avoid spreading germs into your wound. Supplies needed: Soap and water. Disposable gloves. Cleansing or wetting solution, such as saline, germ-free (sterile) water, or an antiseptic solution. Clean bowl. Clean packing material, such as gauze, gauze sponges, or rolled gauze. Clean paper towels. Outer dressing. This includes the cover dressing and tape, or a dressing with an adhesive border. Cotton-tipped swabs. Small plastic bag for trash. How to pack your wound Follow your health care provider's instructions on how often you need to change dressings and pack your wound. You will likely be asked to change your dressings 1 to 2 times a day. Preparing to change the wound packing If needed, take pain medicine 30 minutes before you pack your wound as told by your health care provider. Preparing the new packing material  Clean and  disinfect your work surface or countertop. Set a plastic bag on or near your work surface. Wash your hands with soap and water for at least 20 seconds before you change the dressing. If soap and water are not available, use hand sanitizer. Put a clean paper towel on the counter. Put a clean bowl on the towel. Only touch the outside of the bowl when handling it. Pour the cleansing or wetting solution that your health care provider tells you to use into the bowl. Select and cut your packing material to fit the size of your wound. Avoid using multiple pieces of packing material. Drop it into the bowl. Cut tape strips that you will use to seal the outer dressing, if needed. Put gauze pads for cleansing and cotton-tipped swabs on the clean paper towel. Removing the old packing material and dressing Put on a set of gloves. Gently remove the old dressing and packing material. Make sure to check how the drainage looks or if there is any odor. Clean or rinse (irrigate) the wound. Remove your gloves. Put the removed items, including gloves, into the plastic bag to throw away later. Wash your hands again with soap and water for at least 20 seconds. If soap and water are not available, use hand sanitizer. Applying the new packing material and dressing  Put on a new set of gloves. Squeeze the packing material in the bowl to release the extra liquid. The packing material should be moist, but not dripping wet. Gently place the packing material into the wound. Use a cotton-tipped swab to guide  it into place, filling all of the space. Do not overpack the wound bed. Dry your gloved fingertips on the paper towel. Open up your outer dressing supplies and put them on a dry part of the paper towel. Keep them from getting wet. Place the outer dressing over the packed wound. Tape the edges of the outer dressing in place. Remove your gloves. Wash your hands again with soap and water for at least 20 seconds. If soap  and water are not available, use hand sanitizer. Put the removed items, including gloves, into the plastic bag to throw away. Clean and disinfect your work surface or countertop. General tips Follow your health care provider's instructions on how much to pack the wound. At first, you may need to pack it more fully to help stop bleeding. As the wound begins to heal inside, you will use less packing material and pack the wound loosely to allow the tissue to heal slowly from the inside out. Do not take baths, swim, or use a hot tub until your health care provider approves. Ask your health care provider if you may take showers. You may only be allowed to take sponge baths. Keep the dressing clean and dry. Follow any other instructions given by your health care provider on how to aid healing. This may include applying warm or cold compresses, raising (elevating) the affected area, or wearing a compression dressing. Check your wound site every day for signs of infection. Check for: More redness, swelling, or pain. More fluid or blood. Warmth or hardness (induration). Pus or a bad smell. Protect your wound from the sun when you are outside for the first 6 months, or for as long as told by your health care provider. Cover up the scar area or apply sunscreen that has an SPF of at least 30. Keep all follow-up visits. This is important. Contact a health care provider if: Your pain is not controlled with pain medicine. You have more drainage, redness, swelling, or pain at your wound site. You have new rash, warmth, or induration around the wound. You have a fever or chills. Your wound becomes larger or deeper. Get help right away if: The tissue inside your wound changes color from pink to white, yellow, or black. You notice a bad smell or pus coming from the wound site. You are having trouble packing your wound. Your wound is bleeding, and the bleeding does not stop with gentle pressure. These symptoms  may represent a serious problem that is an emergency. Do not wait to see if the symptoms will go away. Get medical help right away. Call your local emergency services (911 in the [place] Do not drive yourself to the hospital. Summary Wound packing usually involves placing a moistened packing material into your wound and then covering it with an outer bandage (dressing). Follow your health care provider's instructions on how often you need to change dressings and pack your wound. You will likely be asked to change dressings 1 to 2 times a day. When packing your wound, it is important to use gloves to avoid spreading germs into the wound. Check your wound site every day for signs of infection. This information is not intended to replace advice given to you by your health care provider. Make sure you discuss any questions you have with your health care provider. Document Revised: 07/02/2020 Document Reviewed: 07/02/2020 Elsevier Patient Education  2024 [employer]  We will see you back as scheduled below.   If you have  any questions or concerns prior to your appointment, please call our office and speak with a nurse.  Incision and Drainage Incision and drainage is a surgical procedure to open and drain a fluid-filled sac. The sac may be filled with pus, mucus, or blood. Examples of fluid-filled sacs that may need surgical drainage include cysts, skin infections (abscesses), and red lumps that develop from a ruptured cyst or a small abscess (boils). You may need this procedure if the affected area is large, painful, infected, or not healing well. Tell a health care provider about: Any allergies you have. All medicines you are taking, including vitamins, herbs, eye drops, creams, and over-the-counter medicines. Any problems you or family members have had with anesthetic medicines. Any blood disorders you have. Any surgeries you have had. Any medical conditions you have. Whether you are pregnant  or may be pregnant. What are the risks? Generally, this is a safe procedure. However, problems may occur, including: Infection. Bleeding. Allergic reactions to medicines. Scarring.  What happens before the procedure? You may need an ultrasound or other imaging tests to see how large or deep the fluid-filled sac is. You may have blood tests to check for infection. You may get a tetanus shot. You may be given antibiotic medicine to help prevent infection. Follow instructions from your health care provider about eating or drinking restrictions. Ask your health care provider about: Changing or stopping your regular medicines. This is especially important if you are taking diabetes medicines or blood thinners. Taking medicines such as aspirin and ibuprofen. These medicines can thin your blood. Do not take these medicines before your procedure if your health care provider instructs you not to. Plan to have someone take you home after the procedure. If you will be going home right after the procedure, plan to have someone stay with you for 24 hours. What happens during the procedure? To reduce your risk of infection: Your health care team will wash or sanitize their hands. Your skin will be washed with soap. You will be given one or more of the following: A medicine to help you relax (sedative). A medicine to numb the area (local anesthetic). A medicine to make you fall asleep (general anesthetic). An incision will be made in the top of the fluid-filled sac. The contents of the sac may be squeezed out, or a syringe or tube (catheter) may be used to empty the sac. The catheter may be left in place for several weeks to drain any fluid. Or, your health care provider may stitch open the edges of the incision to make a long-term opening for drainage (marsupialization). The inside of the sac may be washed out (irrigated) with a sterile solution and packed with gauze before it is covered with a  bandage (dressing). The procedure may vary among health care providers and hospitals. What happens after the procedure? Your blood pressure, heart rate, breathing rate, and blood oxygen level will be monitored often until the medicines you were given have worn off. Do not drive for 24 hours if you received a sedative. This information is not intended to replace advice given to you by your health care provider. Make sure you discuss any questions you have with your health care provider. Document Released: 08/19/2000 Document Revised: 08/01/2015 Document Reviewed: 12/14/2014 Elsevier Interactive Patient Education  2017 [employer] Inc[employer]   Incision and Drainage, Care After Refer to this sheet in the next few weeks. These instructions provide you with information about caring for yourself after your  procedure. Your health care provider may also give you more specific instructions. Your treatment has been planned according to current medical practices, but problems sometimes occur. Call your health care provider if you have any problems or questions after your procedure. What can I expect after the procedure? After the procedure, it is common to have: Pain or discomfort around your incision site. Drainage from your incision.  Follow these instructions at home: Take over-the-counter and prescription medicines only as told by your health care provider. If you were prescribed an antibiotic medicine, take it as told by your health care provider. Do not stop taking the antibiotic even if you start to feel better. Follow instructions from your health care provider about: How to take care of your incision. When and how you should change your packing and bandage (dressing). Wash your hands with soap and water before you change your dressing. If soap and water are not available, use hand sanitizer. When you should remove your dressing. Do not take baths, swim, or use a hot tub until your health care provider  approves. Keep all follow-up visits as told by your health care provider. This is important. Check your incision area every day for signs of infection. Check for: More redness, swelling, or pain. More fluid or blood. Warmth. Pus or a bad smell. Contact a health care provider if: Your cyst or abscess returns. You have a fever. You have more redness, swelling, or pain around your incision. You have more fluid or blood coming from your incision. Your incision feels warm to the touch. You have pus or a bad smell coming from your incision. Get help right away if: You have severe pain or bleeding. You cannot eat or drink without vomiting. You have decreased urine output. You become short of breath. You have chest pain. You cough up blood. The area where the incision and drainage occurred becomes numb or it tingles. This information is not intended to replace advice given to you by your health care provider. Make sure you discuss any questions you have with your health care provider. Document Released: 05/18/2011 Document Revised: 07/26/2015 Document Reviewed: 12/14/2014 Elsevier Interactive Patient Education  2017 [employer].

## 2023-05-21 NOTE — Progress Notes (Signed)
 05/21/2023  History of Present Illness: Nicholas [name] is a 53 y.o. male presenting for evaluation of a likely infected sebaceous cyst.  The patient was last seen in the office for this on 12/18/2022 at which time he was diagnosed with 2 sebaceous cyst in the left upper back measuring 4.5 and 1.5 cm in size.  Neither 1 was infected at the time and we had discussed potential excision in the future.  The patient reports that over the past 2 weeks, the larger cyst has become bigger in size with more swelling, redness, and tenderness.  Over the course of these 2 weeks he has started to noticed small pustules forming as well as intermittent drainage.  However he continues having an enlarged mass and tenderness.  He was started initially on Bactrim but this caused bilateral hip pain and was changed later to doxycycline.  He has only a couple more days left of this course.  However he has not noticed any huge improvement yet.  He is wondering if this cyst needs to be excised.  He reported having felt mildly febrile with some chills about 2 weeks ago but otherwise denies any further issues.  Past Medical History: Past Medical History:  Diagnosis Date   Diabetes mellitus    Essential hypertension 07/03/2017     Past Surgical History: Past Surgical History:  Procedure Laterality Date   ANTERIOR CERVICAL DECOMP/DISCECTOMY FUSION N/A 07/03/2017   Procedure: ANTERIOR CERVICAL DECOMPRESSION/DISCECTOMY FUSION PLATING 2 C5-6 AND C67;  Surgeon: Tressie Stalker, MD;  Location: [facility] OR;  Service: Neurosurgery;  Laterality: N/A;   APPENDECTOMY      Home Medications: Prior to Admission medications   Medication Sig Start Date End Date Taking? Authorizing Provider  allopurinol (ZYLOPRIM) 100 MG tablet TAKE 1 TABLET BY MOUTH EVERY DAY 05/18/23  Yes [place], Betty G, MD  Dulaglutide (TRULICITY) 1.5 MG/0.5ML SOAJ Inject 1.5 mg into the skin once a week. 12/31/22  Yes Carlus Pavlov, MD  glucose blood (ONETOUCH  VERIO) test strip Use once daily for glucose control, dx 250.00 02/19/22  Yes Carlus Pavlov, MD  lisinopril (ZESTRIL) 5 MG tablet TAKE 1 TABLET DAILY (ENOUGH MEDICATION UNTIL APPOINTMENT) 03/12/23  Yes Carlus Pavlov, MD  MAGNESIUM PO Take 1 capsule by mouth 2 (two) times daily.   Yes [provider]  metFORMIN (GLUCOPHAGE-XR) 500 MG 24 hr tablet TAKE 4 TABLETS WITH DINNER 12/31/22  Yes Carlus Pavlov, MD  Multiple Vitamins-Minerals (MULTIVITAMIN ADULTS) TABS Take 1 tablet by mouth at bedtime.   Yes [provider]  OneTouch Delica Lancets 33G MISC Check 1x a day as advised 12/26/20  Yes Carlus Pavlov, MD  oxyCODONE (ROXICODONE) 5 MG immediate release tablet Take 1 tablet (5 mg total) by mouth every 6 (six) hours as needed for severe pain (pain score 7-10). 05/21/23 05/20/24 Yes Quirino Kakos, Elita Quick, MD  simvastatin (ZOCOR) 20 MG tablet TAKE 1 TABLET BY MOUTH EVERYDAY AT BEDTIME 02/15/23  Yes [place], Betty G, MD  doxycycline (VIBRA-TABS) 100 MG tablet Take 1 tablet (100 mg total) by mouth 2 (two) times daily for 7 days. 05/21/23 05/28/23  Henrene Dodge, MD    Allergies: Allergies  Allergen Reactions   Iodine     Other reaction(s): Eye Redness   Penicillins Rash    Review of Systems: Review of Systems  Constitutional:  Negative for chills and fever.  Respiratory:  Negative for shortness of breath.   Cardiovascular:  Negative for chest pain.  Gastrointestinal:  Negative for nausea and vomiting.  Skin:  Positive for rash.       Redness and drainage from upper back cyst    Physical Exam BP 137/86   Pulse 89   Temp 97.9 F (36.6 C) (Oral)   Ht 5\' 11"  (1.803 m)   Wt 246 lb (111.6 kg)   SpO2 97%   BMI 34.31 kg/m  CONSTITUTIONAL: No acute distress HEENT:  Normocephalic, atraumatic, extraocular motion intact. RESPIRATORY:  Normal respiratory effort without pathologic use of accessory muscles. CARDIOVASCULAR: Regular rhythm and rate. SKIN:  The patient has a 5.5 cm  mass with fluctuance in the left upper back and a smaller 1.5 cm mass adjacent in medial/inferior position.  The larger mass has erythema, mild induration, and fluctuance, with mild inferior ulceration of the skin.  The smaller mass has no evidence of infection.  NEUROLOGIC:  Motor and sensation is grossly normal.  Cranial nerves are grossly intact. PSYCH:  Alert and oriented to person, place and time. Affect is normal.   Assessment and Plan: This is a 53 y.o. male with an infected sebaceous cyst of the upper back.  -- Discussed with patient the findings on exam today showing that the larger sebaceous cyst in the left upper back is larger in size with dependent erythema and induration and fluctuance throughout.  There is mild ulceration of the skin in the inferior portion but no significant necrotic tissue.  Discussed with the patient at this point the best course of action would be to proceed with incision and drainage of this area to treat the infection better rather than proceeding with excision alone as given the infection, I would not be able to close the incision if I were to excise the entire cyst.  As such, it would be best to do this in 2 stages were now we treat the infection and later we excised the cysts.  He is in agreement with this.     Procedure Date:  05/21/2023  Pre-operative Diagnosis:  Infected sebaceous cyst of the left upper back  Post-operative Diagnosis: Infected sebaceous cyst of the left upper back  Procedure:  Incision and Drainage of Infected sebaceous cyst of the left upper back  Surgeon:  Howie Ill, MD  Anesthesia:  5 ml of 1% lidocaine with epi  Estimated Blood Loss:  5 ml  Specimens:  culture swab  Complications:  None  Indications for Procedure:  This is a 53 y.o. male with diagnosis of an infected sebaceous cyst of the left upper back abscess, requiring drainage procedure.  The risks of bleeding, abscess or infection, injury to surrounding  structures, and need for further procedures were all discussed with the patient and was willing to proceed.  Description of Procedure: The patient was correctly identified at bedside.  Appropriate time-outs were performed prior to procedure.  The patient's left upper back was prepped and draped in usual sterile fashion.  Local anesthetic was infused intradermally.  A 1.5 cm incision was made over the abscess, revealing purulent fluid.  This fluid was swabbed for culture and sent to micro.  Small Kelly forceps were used to dissect around the abscess tissue to open any remaining pockets of purulent fluid.  After drainage was completed, the cavity was irrigated and cleaned.  The wound was packed with 1/2 inch iodoform gauze and covered with dry gauze and tape.  The patient tolerated the procedure well and all sharps were appropriately disposed of at the end of the case.  --Discussed dressing changes to be done  once daily. --May shower tomorrow. --May take Tylenol or ibuprofen for mild/moderate pain.  Will send Rx for oxycodone as a precaution as well. --Will follow up on cultures.  Will extend doxycycline course for now. --Follow up in 10 days.   Howie Ill, MD [facility]

## 2023-05-26 LAB — ANAEROBIC AND AEROBIC CULTURE

## 2023-05-31 ENCOUNTER — Ambulatory Visit (INDEPENDENT_AMBULATORY_CARE_PROVIDER_SITE_OTHER): Admitting: Surgery

## 2023-05-31 ENCOUNTER — Encounter: Payer: Self-pay | Admitting: Surgery

## 2023-05-31 VITALS — BP 147/98 | HR 97 | Temp 98.1°F | Ht 71.0 in | Wt 247.6 lb

## 2023-05-31 DIAGNOSIS — Z09 Encounter for follow-up examination after completed treatment for conditions other than malignant neoplasm: Secondary | ICD-10-CM

## 2023-05-31 DIAGNOSIS — L02212 Cutaneous abscess of back [any part, except buttock]: Secondary | ICD-10-CM

## 2023-05-31 DIAGNOSIS — L723 Sebaceous cyst: Secondary | ICD-10-CM

## 2023-05-31 NOTE — Patient Instructions (Signed)
 Incision and Drainage, Care After After incision and drainage, it is common to have: Pain or discomfort around the incision site. Blood, fluid, or pus (drainage) from the incision. Redness and firm skin around the incision site. Follow these instructions at home: Medicines Take over-the-counter and prescription medicines only as told by your health care provider. If you were prescribed antibiotics, take them as told by your provider. Do not stop using the antibiotic even if you start to feel better. Do not apply creams, ointments, or liquids unless you have been told to by your provider. Wound care Follow instructions from your provider about how to take care of your wound. Make sure you: Wash your hands with soap and water for at least 20 seconds before and after you change your bandage (dressing). If soap and water are not available, use hand sanitizer. Change your dressing and any packing as told by your provider. If the dressing is dry or stuck when you try to remove it, moisten or wet it with saline or water. This will help you remove it without harming your skin or tissues. If your wound is packed, leave it in place until your provider tells you to remove it. To remove it, moisten or wet the packing with saline or water. Leave stitches (sutures), skin glue, or tape strips in place. These skin closures may need to stay in place for 2 weeks or longer. If tape strip edges start to loosen and curl up, you may trim the loose edges. Do not remove tape strips completely unless your provider tells you to do that. Check your wound every day for signs of infection. Check for: More redness, swelling, or pain. More fluid or blood. Warmth. Pus or a bad smell. If you were sent home with a drain tube in place, follow instructions from your provider about: How to empty it. How to care for it at home. Be careful when you get rid of used dressings, wound packing, or drainage. Activity Rest the  affected area. Return to your normal activities as told by your provider. Ask your provider what activities are safe for you. General instructions Do not use any products that contain nicotine or tobacco. These products include cigarettes, chewing tobacco, and vaping devices, such as e-cigarettes. These can delay incision healing after surgery. If you need help quitting, ask your provider. Do not take baths, swim, or use a hot tub until your provider approves. Ask your provider if you may take showers. You may only be allowed to take sponge baths. The incision will keep draining. It is normal to have some clear or slightly bloody drainage. The amount of drainage should go down each day. Keep all follow-up visits. Your provider will need to make sure that your incision is healing well and that there are no problems. Your health care provider may give you more instructions. Make sure you know what you can and cannot do Contact a health care provider if: Your cyst or abscess comes back. You have any signs of infection. You notice red streaks that spread away from the incision site. You have a fever or chills. Get help right away if: You have severe pain or bleeding. You become short of breath. You have chest pain. You have signs of a severe infection. You may notice changes in your incision area, such as: Swelling that makes the skin feel hard. Numbness or tingling. Sudden increase in redness. Your skin color may change from red to purple, and then to dark  spots. Blisters, ulcers, or splitting of the skin. These symptoms may be an emergency. Get help right away. Call 911. Do not wait to see if the symptoms will go away. Do not drive yourself to the hospital. This information is not intended to replace advice given to you by your health care provider. Make sure you discuss any questions you have with your health care provider. Document Revised: 10/13/2021 Document Reviewed: 10/13/2021 Elsevier  Patient Education  2024 [employer].

## 2023-05-31 NOTE — Progress Notes (Signed)
 05/31/2023  HPI: Nicholas [name] is a 53 y.o. male s/p incision and drainage of left upper back infected sebaceous cyst on 05/21/2023.  He presents today for follow-up.  He reports that he has been doing better and is no longer having any significant pain at the wound.  He did take oxycodone for the first 2 or 3 days but now is only taking Tylenol or ibuprofen.  He continues packing the wound but has noticed decreased length of the packing gauze needed.  Vital signs: BP (!) 147/98   Pulse 97   Temp 98.1 F (36.7 C) (Oral)   Ht 5\' 11"  (1.803 m)   Wt 247 lb 9.6 oz (112.3 kg)   SpO2 97%   BMI 34.53 kg/m    Physical Exam: Constitutional: No acute distress Skin: Left upper back wound is healing appropriately and is clean, dry, intact with improving erythema.  There is no further induration.  The wound bed has healthy granulation tissue.  Wound was packed again with half-inch iodoform gauze and dressed with dry gauze and tape.  Assessment/Plan: This is a 53 y.o. male s/p incision and drainage of left upper back infected sebaceous cyst.  - The patient's wound is healing appropriately and there is currently no signs of infection.  No further antibiotics are needed at this point.  Instructed the patient to continue packing the wound as currently doing.  This will continue becoming smaller or more shallow as the wound keeps healing. - Follow-up in 2 weeks.   Howie Ill, MD  [facility]

## 2023-06-09 ENCOUNTER — Ambulatory Visit (INDEPENDENT_AMBULATORY_CARE_PROVIDER_SITE_OTHER): Admitting: Surgery

## 2023-06-09 ENCOUNTER — Encounter: Payer: Self-pay | Admitting: Surgery

## 2023-06-09 VITALS — BP 140/86 | HR 93 | Temp 98.1°F | Ht 71.0 in | Wt 246.0 lb

## 2023-06-09 DIAGNOSIS — L723 Sebaceous cyst: Secondary | ICD-10-CM

## 2023-06-09 DIAGNOSIS — Z09 Encounter for follow-up examination after completed treatment for conditions other than malignant neoplasm: Secondary | ICD-10-CM | POA: Diagnosis not present

## 2023-06-09 DIAGNOSIS — L02212 Cutaneous abscess of back [any part, except buttock]: Secondary | ICD-10-CM

## 2023-06-09 NOTE — Progress Notes (Signed)
 06/09/2023  HPI: Nicholas [name] is a 53 y.o. male s/p I&D of infected sebaceous cyst of the left upper back on 05/21/2023.  He presents today for follow-up.  He reports that he has noticed some drainage coming from the entry pore of the cyst is located in the upper portion of the original cyst.  The wound itself that was created for incision and drainage procedure is getting smaller and the amount of packing is also getting smaller.  Denies any worsening pain.  Reports some concern about potential pustules forming around the second smaller sebaceous cyst that is medial to this prior infected one.  Vital signs: BP (!) 140/86   Pulse 93   Temp 98.1 F (36.7 C)   Ht 5\' 11"  (1.803 m)   Wt 246 lb (111.6 kg)   SpO2 98%   BMI 34.31 kg/m    Physical Exam: Constitutional: No acute distress Skin: Left upper back wound is healing appropriately and is clean, dry, intact with good granulation tissue.  Upon applying pressure to the upper area of this cyst, keratin and cyst contents were able to come out through the pore as well as the I&D site.  However there is no purulence or erythema or induration.  The wound was packed again with quarter inch gauze and dressed up with dry gauze and tape.  Assessment/Plan: This is a 53 y.o. male s/p I&D of infected sebaceous cyst of the left upper back.  - Discussed with patient that the wound is healing appropriately and there is no evidence of infection.  The drainage that he is noted is part of the original cyst is still remaining as the cyst was not removed during I&D procedure.  As everything is healing, the capsule of the cyst is healing up again which is allowing more of this keratin formation.  Currently no new procedures are needed at this point we will continue allowing that I&D site to continue healing and the patient will be brought back in about 2 weeks to attempt cyst excision of this area as well as the smaller 1 adjacent to it. - The patient reports that  his wife will be out of town for a couple of days next week.  Discussed with patient that if there is still packing that is needed at that point, he may call us so that we can do a nurse visit for dressing change while his wife is out of town. - Otherwise follow-up with me on 4/16 for excision of both cysts.   Howie Ill, MD [facility]

## 2023-06-09 NOTE — Patient Instructions (Addendum)
 We have changed your packing to 1/4 inch wide now. Continue to change this daily. Once it is too small of an area to pack you may change to just a dry gauze dressing.   Call us on Tuesday to schedule a nurse visit for a wound repacking if needed for Wednesday.    You are scheduled for an excision of your two cysts on your back on 06/23/23.

## 2023-06-10 ENCOUNTER — Encounter: Payer: Self-pay | Admitting: Surgery

## 2023-06-11 ENCOUNTER — Ambulatory Visit: Admitting: Surgery

## 2023-06-15 ENCOUNTER — Ambulatory Visit (INDEPENDENT_AMBULATORY_CARE_PROVIDER_SITE_OTHER)

## 2023-06-15 DIAGNOSIS — L02212 Cutaneous abscess of back [any part, except buttock]: Secondary | ICD-10-CM

## 2023-06-15 NOTE — Progress Notes (Signed)
 Patient came in today for a wound check. The wound is clean, with no signs of infection noted. Wound repacked with 1/4 in plain gauze. Follow up as scheduled.

## 2023-06-23 ENCOUNTER — Encounter: Payer: Self-pay | Admitting: Surgery

## 2023-06-23 ENCOUNTER — Ambulatory Visit (INDEPENDENT_AMBULATORY_CARE_PROVIDER_SITE_OTHER): Admitting: Surgery

## 2023-06-23 VITALS — BP 136/86 | HR 91 | Temp 98.1°F | Ht 71.0 in | Wt 244.0 lb

## 2023-06-23 DIAGNOSIS — L723 Sebaceous cyst: Secondary | ICD-10-CM

## 2023-06-23 DIAGNOSIS — L720 Epidermal cyst: Secondary | ICD-10-CM | POA: Diagnosis not present

## 2023-06-23 DIAGNOSIS — L089 Local infection of the skin and subcutaneous tissue, unspecified: Secondary | ICD-10-CM | POA: Diagnosis not present

## 2023-06-23 MED ORDER — OXYCODONE HCL 5 MG PO TABS: 5.0000 mg | ORAL_TABLET | Freq: Four times a day (QID) | ORAL | 0 refills | Status: DC | PRN

## 2023-06-23 NOTE — Progress Notes (Signed)
  Procedure Date:  06/23/2023  Pre-operative Diagnosis:  Infected left upper back cyst; upper back back cyst (2 cysts total)  Post-operative Diagnosis:  Infected left upper back cyst 3.2 cm; upper back cyst 1.5 cm.  Procedure:  Excision of left upper back cyst and upper back cyst; layered closure of left upper back 4.5 cm incision and 2.8 cm upper back incision.  Surgeon:  Marene Shape, MD  Anesthesia:  12 ml of 1% lidocaine with epi  Estimated Blood Loss:  10 ml  Specimens:   Left upper back cyst Upper back cyst  Complications:  None  Indications for Procedure:  This is a 53 y.o. male with diagnosis of a previously infected left upper back cyst, s/p I&D in the past, and a second upper back cyst.  The patient wishes to have them excised to prevent further infection episodes. The risks of bleeding, abscess or infection, injury to surrounding structures, and need for further procedures were all discussed with the patient and he was willing to proceed.  Description of Procedure: The patient was correctly identified at bedside.  The patient was placed in sitting position due to back pain issues.  Appropriate time-outs were performed.  The patient's upper back was prepped and draped in usual sterile fashion.  Elliptical marks were made around each of the cysts assuring good overlap with the entry pores and prior I&D site.  We started with the larger cyst which had a prior I&D.  Local anesthetic was infused intradermally.  An elliptical 4.5 cm incision was made over the cyst, and scalpel was used to dissect down the skin and subcutaneous tissue.  Skin flaps were created sharply, and then the cyst was excised intact.  It was sent off to pathology.  The cavity was then irrigated and hemostasis was assured with manual pressure.  The wound was then closed in three layers using 2-0 Vicryl, 3-0 Vicryl and 4-0 Monocryl.  The incision was cleaned and sealed with DermaBond.  We then proceeded to the  smaller upper back cyst.  Local anesthetic was infused intradermally.  An elliptical 2.8 cm incision was made over the cyst, and scalpel was used to dissect down the skin and subcutaneous tissue.  Skin flaps were created sharply, and then the cyst was excised intact.  It was sent off to pathology.  The cavity was then irrigated and hemostasis was assured with manual pressure.  The wound was then closed also in three layers using 2-0 Vicryl, 3-0 Vicryl and 4-0 Monocryl.  The incision was cleaned and sealed with DermaBond.  The patient tolerated the procedure well and all sharps were appropriately disposed of at the end of the case.  --Patient may shower tomorrow --Can take Tylenol or Ibuprofen for pain control.  Will also send rx for oxycodone given the size of the incisions. --Discussed with him at length activity restrictions to decrease the tension over the incisions. --Follow up in 10 days.   Marene Shape, MD

## 2023-06-23 NOTE — Patient Instructions (Addendum)
 We have removed a Cyst in our office today.  You have sutures under the skin that will dissolve and also dermabond (skin glue) on top of your skin which will come off on it's own in 10-14 days.  You may use Ibuprofen(600 mg) or Tylenol(2 Extra Strength) as needed for pain control every 6 hours. Use the ice pack 3-4 times a day for the next two days for any achiness. We will sent you in a Narcotic pain medication for excessive pain.  You may shower tomorrow evening. Do not scrub at the area.   Avoid Strenuous activities that will make you sweat during the next 48 hours to avoid the glue coming off prematurely. Avoid activities that will place pressure to this area of the body for 1-2 weeks to avoid re-injury to incision site.  Please see your follow-up appointment provided. We will see you back in office to make sure this area is healed and to review the final pathology. If you have any questions or concerns prior to this appointment, call our office and speak with a nurse.    Excision of Skin Cysts or Lesions Excision of a skin lesion refers to the removal of a section of skin by making small cuts (incisions) in the skin. This procedure may be done to remove a cancerous (malignant) or noncancerous (benign) growth on the skin. It is typically done to treat or prevent cancer or infection. It may also be done to improve cosmetic appearance. The procedure may be done to remove: Cancerous growths, such as basal cell carcinoma, squamous cell carcinoma, or melanoma. Noncancerous growths, such as a cyst or lipoma. Growths, such as moles or skin tags, which may be removed for cosmetic reasons.  Various excision or surgical techniques may be used depending on your condition, the location of the lesion, and your overall health. Tell a health care provider about: Any allergies you have. All medicines you are taking, including vitamins, herbs, eye drops, creams, and over-the-counter medicines. Any  problems you or family members have had with anesthetic medicines. Any blood disorders you have. Any surgeries you have had. Any medical conditions you have. Whether you are pregnant or may be pregnant. What are the risks? Generally, this is a safe procedure. However, problems may occur, including: Bleeding. Infection. Scarring. Recurrence of the cyst, lipoma, or cancer. Changes in skin sensation or appearance, such as discoloration or swelling. Reaction to the anesthetics. Allergic reaction to surgical materials or ointments. Damage to nerves, blood vessels, muscles, or other structures. Continued pain.  What happens before the procedure? Ask your health care provider about: Changing or stopping your regular medicines. This is especially important if you are taking diabetes medicines or blood thinners. Taking medicines such as aspirin and ibuprofen. These medicines can thin your blood. Do not take these medicines before your procedure if your health care provider instructs you not to. You may be asked to take certain medicines. You may be asked to stop smoking. You may have an exam or testing. What happens during the procedure? To reduce your risk of infection: Your health care team will wash or sanitize their hands. Your skin will be washed with soap. You will be given a medicine to numb the area (local anesthetic). One of the following excision techniques will be performed. At the end of any of these procedures, antibiotic ointment will be applied as needed. Each of the following techniques may vary among health care providers and hospitals. Complete Surgical Excision The area of  skin that needs to be removed will be marked with a pen. Using a small scalpel or scissors, the surgeon will gently cut around and under the lesion until it is completely removed. The lesion will be placed in a fluid and sent to the lab for examination. If necessary, bleeding will be controlled with a  device that delivers heat (electrocautery). The edges of the wound may be stitched (sutured) together, and a bandage (dressing) or surgical glue will be applied. This procedure may be performed to treat a cancerous growth or a noncancerous cyst or lesion.  What happens after the procedure? Return to your normal activities as told by your health care provider. Report any excessive bleeding, spreading redness, or increased pain.

## 2023-06-29 LAB — SURGICAL PATHOLOGY

## 2023-06-30 NOTE — Progress Notes (Incomplete)
 HPI:  Mr. Nicholas [name] is a 53 y.o.male with a PMHx significant for DM II, PVC's, gout, HLD, and HTN, who is here today for his routine physical examination.  Last CPE: not on file  Exercise: Diet:  Sleep:  Alcohol Use:  Smoking: Vision:  Dental:  Immunization History  Administered Date(s) Administered   Influenza,inj,Quad PF,6+ Mos 01/05/2020, 12/26/2020   PFIZER(Purple Top)SARS-COV-2 Vaccination 06/08/2019, 06/29/2019   Tdap 07/02/2017, 05/02/2023   Health Maintenance  Topic Date Due   Pneumococcal Vaccine 65-37 Years old (1 of 2 - PCV) Never done   Zoster Vaccines- Shingrix (1 of 2) Never done   Colonoscopy  Never done   COVID-19 Vaccine (3 - Pfizer risk series) 07/27/2019   OPHTHALMOLOGY EXAM  08/08/2022   INFLUENZA VACCINE  10/08/2023   HEMOGLOBIN A1C  11/03/2023   Diabetic kidney evaluation - eGFR measurement  12/31/2023   Diabetic kidney evaluation - Urine ACR  12/31/2023   FOOT EXAM  12/31/2023   DTaP/Tdap/Td (3 - Td or Tdap) 05/01/2033   Hepatitis C Screening  Completed   HIV Screening  Completed   HPV VACCINES  Aged Out   Meningococcal B Vaccine  Aged Out   Last prostate ca screening: ***  Chronic medical problems: ***  Concerns today: ***  Review of Systems  Current Outpatient Medications on File Prior to Visit  Medication Sig Dispense Refill   allopurinol  (ZYLOPRIM ) 100 MG tablet TAKE 1 TABLET BY MOUTH EVERY DAY 90 tablet 2   Dulaglutide  (TRULICITY ) 1.5 MG/0.5ML SOAJ Inject 1.5 mg into the skin once a week. 6 mL 3   glucose blood (ONETOUCH VERIO) test strip Use once daily for glucose control, dx 250.00 100 each 3   lisinopril  (ZESTRIL ) 5 MG tablet TAKE 1 TABLET DAILY (ENOUGH MEDICATION UNTIL APPOINTMENT) 90 tablet 3   MAGNESIUM PO Take 1 capsule by mouth 2 (two) times daily.     metFORMIN  (GLUCOPHAGE -XR) 500 MG 24 hr tablet TAKE 4 TABLETS WITH DINNER 360 tablet 3   Multiple Vitamins-Minerals (MULTIVITAMIN ADULTS) TABS Take 1 tablet by mouth at  bedtime.     OneTouch Delica Lancets 33G MISC Check 1x a day as advised 100 each 3   oxyCODONE  (ROXICODONE ) 5 MG immediate release tablet Take 1 tablet (5 mg total) by mouth every 6 (six) hours as needed for severe pain (pain score 7-10). 20 tablet 0   simvastatin  (ZOCOR ) 20 MG tablet TAKE 1 TABLET BY MOUTH EVERYDAY AT BEDTIME 90 tablet 3   No current facility-administered medications on file prior to visit.    Past Medical History:  Diagnosis Date   Diabetes mellitus    Essential hypertension 07/03/2017    Past Surgical History:  Procedure Laterality Date   ANTERIOR CERVICAL DECOMP/DISCECTOMY FUSION N/A 07/03/2017   Procedure: ANTERIOR CERVICAL DECOMPRESSION/DISCECTOMY FUSION PLATING 2 C5-6 AND C67;  Surgeon: Nicholas Kansas, MD;  Location: [facility] OR;  Service: Neurosurgery;  Laterality: N/A;   APPENDECTOMY      Allergies  Allergen Reactions   Iodine     Other reaction(s): Eye Redness   Penicillins Rash    No family history on file.  Social History   Socioeconomic History   Marital status: Married    Spouse name: Not on file   Number of children: Not on file   Years of education: Not on file   Highest education level: Doctorate  Occupational History   Not on file  Tobacco Use   Smoking status: Former    Current  packs/day: 0.00    Average packs/day: 1 pack/day for 18.0 years (18.0 ttl pk-yrs)    Types: Cigarettes    Start date: 11/07/1989    Quit date: 11/08/2007    Years since quitting: 15.6    Passive exposure: Past   Smokeless tobacco: Never  Vaping Use   Vaping status: Never Used  Substance and Sexual Activity   Alcohol use: No   Drug use: No   Sexual activity: Not on file  Other Topics Concern   Not on file  Social History Narrative   Not on file   Social Drivers of Health   Financial Resource Strain: Patient Declined (05/14/2023)   Overall Financial Resource Strain (CARDIA)    Difficulty of Paying Living Expenses: Patient declined  Food Insecurity: Patient  Declined (05/14/2023)   Hunger Vital Sign    Worried About Running Out of Food in the Last Year: Patient declined    Ran Out of Food in the Last Year: Patient declined  Transportation Needs: Patient Declined (05/14/2023)   PRAPARE - Administrator, Civil Service (Medical): Patient declined    Lack of Transportation (Non-Medical): Patient declined  Physical Activity: Unknown (05/14/2023)   Exercise Vital Sign    Days of Exercise per Week: Patient declined    Minutes of Exercise per Session: Not on file  Stress: Patient Declined (05/14/2023)   [employer] of Occupational Health - Occupational Stress Questionnaire    Feeling of Stress : Patient declined  Social Connections: Unknown (05/14/2023)   Social Connection and Isolation Panel [NHANES]    Frequency of Communication with Friends and Family: Patient declined    Frequency of Social Gatherings with Friends and Family: Patient declined    Attends Religious Services: Patient declined    Database administrator or Organizations: Patient declined    Attends Banker Meetings: Not on file    Marital Status: Patient declined    There were no vitals filed for this visit. There is no height or weight on file to calculate BMI.  Wt Readings from Last 3 Encounters:  06/23/23 244 lb (110.7 kg)  06/09/23 246 lb (111.6 kg)  05/31/23 247 lb 9.6 oz (112.3 kg)    Physical Exam  ASSESSMENT AND PLAN:  Mr. Nicholas [name] was seen today for his routine general medical examination.   No orders of the defined types were placed in this encounter.  There are no diagnoses linked to this encounter.  No follow-ups on file.  I, Nicholas [name], acting as a scribe for Nicholas Swaziland, MD., have documented all relevant documentation on the behalf of Nicholas Swazil[place], MD, as directed by  Nicholas Swaziland, MD while in the presence of Nicholas Swazil[place], MD.   I, Nicholas [place], MD, have reviewed all documentation for this visit. The documentation on 06/30/23  for the exam, diagnosis, procedures, and orders are all accurate and complete.  Nicholas G. Swaziland, MD  [facility]. [place] office.

## 2023-07-02 ENCOUNTER — Encounter: Payer: Self-pay | Admitting: Surgery

## 2023-07-02 ENCOUNTER — Other Ambulatory Visit: Payer: BC Managed Care – PPO

## 2023-07-02 ENCOUNTER — Ambulatory Visit: Payer: BC Managed Care – PPO | Admitting: Family Medicine

## 2023-07-02 ENCOUNTER — Ambulatory Visit: Admitting: Surgery

## 2023-07-02 VITALS — BP 130/85 | HR 90 | Ht 71.0 in | Wt 246.0 lb

## 2023-07-02 DIAGNOSIS — L089 Local infection of the skin and subcutaneous tissue, unspecified: Secondary | ICD-10-CM

## 2023-07-02 DIAGNOSIS — L723 Sebaceous cyst: Secondary | ICD-10-CM

## 2023-07-02 DIAGNOSIS — Z09 Encounter for follow-up examination after completed treatment for conditions other than malignant neoplasm: Secondary | ICD-10-CM

## 2023-07-02 NOTE — Progress Notes (Signed)
 07/02/2023  HPI: Nicholas [name] is a 53 y.o. male s/p excision of 2 sebaceous cysts in the upper back on 06/23/2023.  Patient presents today for follow-up.  Patient reports that initially there was some tightness associated with the incisions when trying to move his shoulders forward.  However this has been improving over the last week.  Vital signs: BP 130/85   Pulse 90   Ht 5\' 11"  (1.803 m)   Wt 246 lb (111.6 kg)   SpO2 97%   BMI 34.31 kg/m    Physical Exam: Constitutional: No acute distress Skin: Two left upper back incisions are healing well and are clean, dry, intact with Dermabond still in place but peeling off.  No evidence of infection or dehiscence.  Assessment/Plan: This is a 53 y.o. male s/p excision of 2 sebaceous cysts in the upper back.  - Discussed with the patient that both incisions are healing appropriately and there is improved laxity in the area consistent with the patient's reports of decreased tightness.  There is no evidence of any complications at this point. - Discussed with patient still some activity restrictions over the next couple of weeks. - Reviewed with him pathology results showing that both of these were sebaceous cysts. - Follow-up as needed.   Marene Shape, MD [facility]

## 2023-07-02 NOTE — Patient Instructions (Addendum)
 Follow-up with our office as needed.  Please call and ask to speak with a nurse if you develop questions or concerns.   GENERAL POST-OPERATIVE PATIENT INSTRUCTIONS   WOUND CARE INSTRUCTIONS: Try to keep the wound dry and avoid ointments on the wound unless directed to do so.  If the wound becomes bright red and painful or starts to drain infected material that is not clear, please contact your physician immediately.  If the wound is mildly pink and has a thick firm ridge underneath it, this is normal, and is referred to as a healing ridge. This will resolve over the next 4-6 weeks.  DIET:  You may eat any foods that you can tolerate.  It is a good idea to eat a high fiber diet and take in plenty of fluids to prevent constipation.  If you do become constipated you may want to take a mild laxative or take ducolax tablets on a daily basis until your bowel habits are regular.  Constipation can be very uncomfortable, along with straining, after recent surgery.  SUNBLOCK Use sun block to incision area over the next year if this area will be exposed to sun. This helps decrease scarring and will allow you avoid a permanent darkened area over your incision.  QUESTIONS:  Please feel free to call our office if you have any questions, and we will be glad to assist you. [phone]

## 2023-07-09 ENCOUNTER — Encounter: Admitting: Family Medicine

## 2023-07-16 ENCOUNTER — Encounter (HOSPITAL_COMMUNITY): Payer: Self-pay

## 2023-07-23 ENCOUNTER — Ambulatory Visit: Admitting: Family Medicine

## 2023-08-30 ENCOUNTER — Other Ambulatory Visit: Payer: Self-pay

## 2023-08-30 MED ORDER — SIMVASTATIN 20 MG PO TABS: 20.0000 mg | ORAL_TABLET | Freq: Every day | ORAL | 0 refills | Status: DC

## 2023-09-08 ENCOUNTER — Telehealth: Payer: Self-pay

## 2023-09-08 ENCOUNTER — Other Ambulatory Visit (HOSPITAL_COMMUNITY): Payer: Self-pay

## 2023-09-08 NOTE — Telephone Encounter (Signed)
 Pt needs a PA for Trulicity.

## 2023-09-08 NOTE — Telephone Encounter (Signed)
 Pharmacy Patient Advocate Encounter   Received notification from CoverMyMeds that prior authorization for Trulicity  1.5mg  is required/requested.   Insurance verification completed.   The patient is insured through [employer] .   Per test claim: PA required and submitted KEY/EOC/Request #: [id number] from 08/09/23 to 09/07/24  PA Case ID #: [id number]

## 2023-10-07 ENCOUNTER — Ambulatory Visit: Payer: BC Managed Care – PPO | Admitting: Internal Medicine

## 2023-11-12 ENCOUNTER — Ambulatory Visit: Admitting: Internal Medicine

## 2023-11-18 ENCOUNTER — Ambulatory Visit: Admitting: Internal Medicine

## 2023-11-18 ENCOUNTER — Encounter: Payer: Self-pay | Admitting: Internal Medicine

## 2023-11-18 VITALS — BP 120/70 | HR 83 | Ht 71.0 in | Wt 244.8 lb

## 2023-11-18 DIAGNOSIS — E785 Hyperlipidemia, unspecified: Secondary | ICD-10-CM | POA: Diagnosis not present

## 2023-11-18 DIAGNOSIS — Z7984 Long term (current) use of oral hypoglycemic drugs: Secondary | ICD-10-CM

## 2023-11-18 DIAGNOSIS — E1142 Type 2 diabetes mellitus with diabetic polyneuropathy: Secondary | ICD-10-CM

## 2023-11-18 DIAGNOSIS — E66811 Obesity, class 1: Secondary | ICD-10-CM

## 2023-11-18 LAB — POCT GLYCOSYLATED HEMOGLOBIN (HGB A1C): Hemoglobin A1C: 6.2 % — AB (ref 4.0–5.6)

## 2023-11-18 MED ORDER — TRULICITY 1.5 MG/0.5ML ~~LOC~~ SOAJ: 1.5000 mg | SUBCUTANEOUS | 3 refills | Status: AC

## 2023-11-18 MED ORDER — METFORMIN HCL ER 500 MG PO TB24: ORAL_TABLET | ORAL | 3 refills | Status: AC

## 2023-11-18 NOTE — Patient Instructions (Addendum)
 Please continue: - Metformin  ER 2000 mg with dinner - Trulicity  1.5 mg weekly  Please return in 6 months with your sugar log.

## 2023-11-18 NOTE — Progress Notes (Signed)
 Patient ID: Nicholas [name], male   DOB: 08/05/1970, 53 y.o.   MRN: [id number]   HPI: Nicholas [name] is a 53 y.o.-year-old male, presenting for follow-up for DM2, dx in ~2012, non-insulin -dependent, uncontrolled, with complications (PN).  Last visit 7 months ago.  Interim history: No increased urination, blurry vision, nausea, chest pain. He had increased stress since last OV with family members being sick. He has been traveling and eating out more.  Reviewed HbA1c levels: Lab Results  Component Value Date   HGBA1C 6.7 (A) 05/06/2023   HGBA1C 7.3 (A) 12/31/2022   HGBA1C 6.3 (A) 02/19/2022  He was on a Prednisone  taper for 7 days for neck pain >> 01/2015.  Pt is on a regimen of: - Metformin  ER 2000 mg with dinner - Trulicity  0.75 >> 1.5 mg weekly Had diarrhea, some nausea - with regular Metformin , but not with the ER formulation He was on Glipizide  before >> had to stop b/c hypoglycemia. He was previously on Farxiga  10 mg daily but came off... He was previously on Januvia  before switching to Trulicity .  Pt checks his sugars once a day: - am: 149-230 >> 114-157 (ave 138) >> 101-140, 176 (121 ave) - 2h after b'fast: n/c >> 110-140 (ave 123) >> n/c >> 216 >> n/c - before lunch: 98-128 (ave 116) >> 130-160, 190 (no b'fast) >> 101-108 - 2h after lunch: n/c >> 121 >> n/c >> 98-128 (ave 176) >> n/c - before dinner:  108-213 (ave 129) >> 97, 109-140 >> 81-113 >> n/c - 2h after dinner:  136-179, 297>> 104-240 (ave 141) >> 124-210 (ave 152) - bedtime: n/c >> 114-138 (122) >> 97-178 (134) - nighttime: n/c Lowest sugar was 97 >> 81 >> 101; he has hypoglycemia awareness in the 70s. Highest sugar was 297 >> 240 >> 210.  Glucometer: Verio IQ  Pt's meals are: - Breakfast: skips usually - Lunch: sandwich/salad - Dinner: varies - Snacks: rarely, 1-2  -No CKD, last BUN/creatinine:  Lab Results  Component Value Date   BUN 14 12/31/2022   BUN 13 02/20/2022   CREATININE 0.94 12/31/2022    CREATININE 0.84 02/20/2022   Lab Results  Component Value Date   MICRALBCREAT 2.4 07/08/2015  On lisinopril .  -+ HL; last set of lipids: Lab Results  Component Value Date   CHOL 141 12/31/2022   HDL 38.10 (L) 12/31/2022   LDLCALC 67 12/31/2022   LDLDIRECT 92.0 02/20/2022   TRIG 180.0 (H) 12/31/2022   CHOLHDL 4 12/31/2022  On Zocor  10 mg daily.  - last eye exam was in 08/2021: No DR reportedly. Dr. Charmayne. He has some floaters.  - he has numbness in his feet.  Last foot exam 12/31/2022.  On ASA 81.  He has a history of gout.  ROS: + See HPI  I reviewed pt's medications, allergies, PMH, social hx, family hx, and changes were documented in the history of present illness. Otherwise, unchanged from my initial visit note.  Patient Active Problem List   Diagnosis Date Noted   Type 2 diabetes mellitus with peripheral neuropathy (HCC) 06/05/2010    Priority: High   Joint pain, foot 02/20/2022   Sebaceous cyst 02/20/2022   Cervical vertebral fracture (HCC) 07/03/2017   Essential hypertension 07/03/2017   Leukocytosis 07/03/2017   C7 cervical fracture (HCC) 07/03/2017   Left shoulder pain 12/19/2014   Hyperlipidemia 03/06/2008   PREMATURE VENTRICULAR CONTRACTIONS 11/07/2007   Past surgical history: - Appendectomy in 1982   Social History   Social  History   Marital status: Married    Spouse name: N/A   Number of children: 1- [place]    Occupational History   Scientist    Social History Main Topics   Smoking status: Former Smoker    Quit date: 03/10/2007   Smokeless tobacco: Never Used   Alcohol use No, 1 or 2 drinks per year    Drug use: No   Current Outpatient Medications on File Prior to Visit  Medication Sig Dispense Refill   allopurinol  (ZYLOPRIM ) 100 MG tablet TAKE 1 TABLET BY MOUTH EVERY DAY 90 tablet 2   Dulaglutide  (TRULICITY ) 1.5 MG/0.5ML SOAJ Inject 1.5 mg into the skin once a week. 6 mL 3   glucose blood (ONETOUCH VERIO) test strip Use once daily for  glucose control, dx 250.00 100 each 3   lisinopril  (ZESTRIL ) 5 MG tablet TAKE 1 TABLET DAILY (ENOUGH MEDICATION UNTIL APPOINTMENT) 90 tablet 3   MAGNESIUM PO Take 1 capsule by mouth 2 (two) times daily.     metFORMIN  (GLUCOPHAGE -XR) 500 MG 24 hr tablet TAKE 4 TABLETS WITH DINNER 360 tablet 3   Multiple Vitamins-Minerals (MULTIVITAMIN ADULTS) TABS Take 1 tablet by mouth at bedtime.     OneTouch Delica Lancets 33G MISC Check 1x a day as advised 100 each 3   simvastatin  (ZOCOR ) 20 MG tablet Take 1 tablet (20 mg total) by mouth daily at 6 PM. Due for physical/follow up 90 tablet 0   No current facility-administered medications on file prior to visit.   Allergies  Allergen Reactions   Iodine     Other reaction(s): Eye Redness   Penicillins Rash   Family history: - See history of present illness, also: - HTN and thyroid disease in grandfather  PE: BP 120/70   Pulse 83   Ht 5' 11 (1.803 m)   Wt 244 lb 12.8 oz (111 kg)   SpO2 98%   BMI 34.14 kg/m  Wt Readings from Last 10 Encounters:  11/18/23 244 lb 12.8 oz (111 kg)  07/02/23 246 lb (111.6 kg)  06/23/23 244 lb (110.7 kg)  06/09/23 246 lb (111.6 kg)  05/31/23 247 lb 9.6 oz (112.3 kg)  05/21/23 246 lb (111.6 kg)  05/14/23 251 lb 6 oz (114 kg)  05/06/23 252 lb (114.3 kg)  12/31/22 254 lb 12.8 oz (115.6 kg)  12/18/22 251 lb 3.2 oz (113.9 kg)   Constitutional: overweight, in NAD Eyes:  EOMI, no exophthalmos ENT: no neck masses, no cervical lymphadenopathy Cardiovascular: RRR, No MRG Respiratory: CTA B Musculoskeletal: no deformities Skin:no rashes Neurological: no tremor with outstretched hands Diabetic Foot Exam - Simple   Simple Foot Form Diabetic Foot exam was performed with the following findings: Yes 11/18/2023  8:49 AM  Visual Inspection No deformities, no ulcerations, no other skin breakdown bilaterally: Yes Sensation Testing Intact to touch and monofilament testing bilaterally: Yes Pulse Check Posterior Tibialis  and Dorsalis pulse intact bilaterally: Yes Comments + L lateral callus    ASSESSMENT: 1. DM2, non-insulin -dependent, uncontrolled, with complications - PN  2. HL  3.  Obesity class I  PLAN:  1. Patient with longstanding, previously uncontrolled type 2 diabetes, with improved control on metformin  ER and weekly GLP-1 receptor agonist.  He is not usually compliant with appointments.  He returns after an absence of several months, previously returned after 10 months absence.  HbA1c at last visit was lower, at 6.7%.  Sugars were better at that time, lower in the morning with still occasional hyperglycemic spikes.  We discussed about possible causes for these.  However, overall, sugars were mostly at goal and I did not suggest a change in regimen. - At today's visit, sugars are mostly at goal, with only occasional mild hyperglycemic spikes after dietary indiscretions.  However, overall, sugars appear to be improved, despite traveling more over the last few months and eating more out.  I did not recommend a change in regimen for now. - I suggested to:  Patient Instructions  Please continue: - Metformin  ER 2000 mg with dinner - Trulicity  1.5 mg weekly  Please return in 6 months with your sugar log.   - we checked his HbA1c: 6.2% (lower) - advised to check sugars at different times of the day - 1x a day, rotating check times - advised for yearly eye exams >> he is UTD - at today's visit, we discussed different measures to help with his left foot callus. - will check an ACR today - return to clinic in 6 months  2. HL - Latest lipid panel from 12/2022 showed an LDL at goal, triglycerides slightly elevated and a low HDL (nonfasting: Lab Results  Component Value Date   CHOL 141 12/31/2022   HDL 38.10 (L) 12/31/2022   LDLCALC 67 12/31/2022   LDLDIRECT 92.0 02/20/2022   TRIG 180.0 (H) 12/31/2022   CHOLHDL 4 12/31/2022  -He continues on simvastatin  20 mg daily without side effects  3.  Obesity class I - He is on Trulicity  which should also help with weight loss - He lost 3 pounds before last visit - He lost 8 pounds since last visit  Lela Fendt, MD PhD [facility] Endocrinology

## 2023-11-19 ENCOUNTER — Ambulatory Visit: Payer: Self-pay | Admitting: Internal Medicine

## 2023-11-19 LAB — MICROALBUMIN / CREATININE URINE RATIO
Creatinine, Urine: 170 mg/dL (ref 20–320)
Microalb Creat Ratio: 12 mg/g{creat} (ref <30)
Microalb, Ur: 2.1 mg/dL

## 2024-02-19 ENCOUNTER — Other Ambulatory Visit: Payer: Self-pay | Admitting: Family Medicine

## 2024-03-06 ENCOUNTER — Other Ambulatory Visit: Payer: Self-pay | Admitting: Internal Medicine

## 2024-03-06 DIAGNOSIS — E1142 Type 2 diabetes mellitus with diabetic polyneuropathy: Secondary | ICD-10-CM

## 2024-05-18 ENCOUNTER — Ambulatory Visit: Admitting: Internal Medicine
